# Patient Record
Sex: Male | Born: 1950 | Race: Black or African American | Hispanic: No | Marital: Single
Health system: Southern US, Community
[De-identification: ages and names within clinical notes are randomized; demographics above are authoritative.]

---

## 2009-04-18 ENCOUNTER — Emergency Department: Payer: Self-pay | Admitting: Emergency Medicine

## 2009-05-08 ENCOUNTER — Ambulatory Visit: Payer: Self-pay | Admitting: Oncology

## 2009-05-16 ENCOUNTER — Ambulatory Visit: Payer: Self-pay | Admitting: Family Medicine

## 2009-05-17 ENCOUNTER — Ambulatory Visit: Payer: Self-pay | Admitting: Family Medicine

## 2009-06-04 ENCOUNTER — Ambulatory Visit: Payer: Self-pay | Admitting: Family Medicine

## 2009-06-04 ENCOUNTER — Ambulatory Visit: Payer: Self-pay | Admitting: Otolaryngology

## 2009-06-06 ENCOUNTER — Ambulatory Visit: Payer: Self-pay | Admitting: Oncology

## 2009-06-07 ENCOUNTER — Ambulatory Visit: Payer: Self-pay | Admitting: Oncology

## 2009-06-14 ENCOUNTER — Ambulatory Visit: Payer: Self-pay | Admitting: Vascular Surgery

## 2009-06-15 ENCOUNTER — Ambulatory Visit: Payer: Self-pay | Admitting: Oncology

## 2009-06-22 ENCOUNTER — Inpatient Hospital Stay: Payer: Self-pay | Admitting: Oncology

## 2009-07-02 ENCOUNTER — Ambulatory Visit: Payer: Self-pay

## 2009-07-08 ENCOUNTER — Ambulatory Visit: Payer: Self-pay | Admitting: Oncology

## 2009-08-08 ENCOUNTER — Ambulatory Visit: Payer: Self-pay | Admitting: Oncology

## 2009-09-07 ENCOUNTER — Ambulatory Visit: Payer: Self-pay | Admitting: Oncology

## 2009-10-08 ENCOUNTER — Ambulatory Visit: Payer: Self-pay | Admitting: Oncology

## 2009-11-07 ENCOUNTER — Ambulatory Visit: Payer: Self-pay | Admitting: Oncology

## 2009-12-08 ENCOUNTER — Ambulatory Visit: Payer: Self-pay | Admitting: Oncology

## 2010-01-08 ENCOUNTER — Ambulatory Visit: Payer: Self-pay | Admitting: Oncology

## 2010-02-05 ENCOUNTER — Ambulatory Visit: Payer: Self-pay | Admitting: Oncology

## 2010-04-07 ENCOUNTER — Ambulatory Visit: Payer: Self-pay | Admitting: Oncology

## 2010-04-18 ENCOUNTER — Ambulatory Visit: Payer: Self-pay | Admitting: Oncology

## 2010-05-08 ENCOUNTER — Ambulatory Visit: Payer: Self-pay | Admitting: Oncology

## 2010-05-24 ENCOUNTER — Ambulatory Visit: Payer: Self-pay | Admitting: Oncology

## 2010-06-07 ENCOUNTER — Ambulatory Visit: Payer: Self-pay | Admitting: Oncology

## 2011-03-20 ENCOUNTER — Ambulatory Visit: Payer: Self-pay | Admitting: Oncology

## 2011-04-02 ENCOUNTER — Ambulatory Visit: Payer: Self-pay

## 2011-04-08 ENCOUNTER — Ambulatory Visit: Payer: Self-pay | Admitting: Oncology

## 2011-08-15 ENCOUNTER — Ambulatory Visit: Payer: Self-pay | Admitting: Oncology

## 2011-08-18 ENCOUNTER — Ambulatory Visit: Payer: Self-pay | Admitting: Oncology

## 2011-09-08 ENCOUNTER — Ambulatory Visit: Payer: Self-pay | Admitting: Oncology

## 2012-02-12 ENCOUNTER — Ambulatory Visit: Payer: Self-pay | Admitting: Oncology

## 2012-02-18 ENCOUNTER — Ambulatory Visit: Payer: Self-pay | Admitting: Oncology

## 2012-02-18 LAB — COMPREHENSIVE METABOLIC PANEL
Alkaline Phosphatase: 116 U/L (ref 50–136)
Anion Gap: 6 — ABNORMAL LOW (ref 7–16)
BUN: 13 mg/dL (ref 7–18)
Bilirubin,Total: 0.4 mg/dL (ref 0.2–1.0)
Calcium, Total: 10.3 mg/dL — ABNORMAL HIGH (ref 8.5–10.1)
Chloride: 97 mmol/L — ABNORMAL LOW (ref 98–107)
Co2: 31 mmol/L (ref 21–32)
EGFR (African American): >60
EGFR (Non-African Amer.): >60
SGOT(AST): 28 U/L (ref 15–37)
SGPT (ALT): 53 U/L
Total Protein: 9 g/dL — ABNORMAL HIGH (ref 6.4–8.2)

## 2012-02-18 LAB — CBC CANCER CENTER
Basophil %: 0.3 %
Eosinophil #: 0.1 x10 3/mm (ref 0.0–0.7)
Eosinophil %: 0.8 %
HCT: 39.6 % — ABNORMAL LOW (ref 40.0–52.0)
HGB: 12.4 g/dL — ABNORMAL LOW (ref 13.0–18.0)
Lymphocyte #: 1.5 x10 3/mm (ref 1.0–3.6)
MCH: 26.2 pg (ref 26.0–34.0)
MCHC: 31.3 g/dL — ABNORMAL LOW (ref 32.0–36.0)
MCV: 83.9 fL (ref 80–100)
Monocyte #: 1.2 x10 3/mm — ABNORMAL HIGH (ref 0.0–0.7)
Monocyte %: 11.9 %
Neutrophil #: 7.7 x10 3/mm — ABNORMAL HIGH (ref 1.4–6.5)
Platelet: 379 x10 3/mm (ref 150–440)

## 2012-03-03 ENCOUNTER — Ambulatory Visit: Payer: Self-pay | Admitting: Oncology

## 2012-03-03 ENCOUNTER — Emergency Department: Payer: Self-pay | Admitting: Emergency Medicine

## 2012-03-03 LAB — COMPREHENSIVE METABOLIC PANEL
Anion Gap: 8 (ref 7–16)
Calcium, Total: 9.8 mg/dL (ref 8.5–10.1)
Chloride: 96 mmol/L — ABNORMAL LOW (ref 98–107)
Co2: 29 mmol/L (ref 21–32)
Glucose: 114 mg/dL — ABNORMAL HIGH (ref 65–99)
Osmolality: 266 (ref 275–301)
Potassium: 4.1 mmol/L (ref 3.5–5.1)
SGOT(AST): 35 U/L (ref 15–37)
SGPT (ALT): 50 U/L
Sodium: 133 mmol/L — ABNORMAL LOW (ref 136–145)

## 2012-03-03 LAB — CBC
HCT: 37.3 % — ABNORMAL LOW (ref 40.0–52.0)
HGB: 11.6 g/dL — ABNORMAL LOW (ref 13.0–18.0)
MCHC: 31 g/dL — ABNORMAL LOW (ref 32.0–36.0)
MCV: 83 fL (ref 80–100)
RBC: 4.48 10*6/uL (ref 4.40–5.90)
RDW: 11.3 % — ABNORMAL LOW (ref 11.5–14.5)
WBC: 10.4 10*3/uL (ref 3.8–10.6)

## 2012-03-03 LAB — CK TOTAL AND CKMB (NOT AT ARMC): CK, Total: 167 U/L (ref 35–232)

## 2012-03-08 ENCOUNTER — Ambulatory Visit: Payer: Self-pay | Admitting: Oncology

## 2012-04-08 ENCOUNTER — Ambulatory Visit: Payer: Self-pay | Admitting: Oncology

## 2012-04-20 ENCOUNTER — Ambulatory Visit: Payer: Self-pay | Admitting: Oncology

## 2012-05-08 ENCOUNTER — Ambulatory Visit: Payer: Self-pay | Admitting: Oncology

## 2012-07-05 ENCOUNTER — Ambulatory Visit: Payer: Self-pay | Admitting: Oncology

## 2012-07-08 ENCOUNTER — Ambulatory Visit: Payer: Self-pay | Admitting: Oncology

## 2012-07-12 LAB — CBC CANCER CENTER
Basophil #: 0 x10 3/mm (ref 0.0–0.1)
Eosinophil #: 0.1 x10 3/mm (ref 0.0–0.7)
Lymphocyte %: 8.1 %
MCHC: 32.5 g/dL (ref 32.0–36.0)
Monocyte #: 0.7 x10 3/mm (ref 0.2–1.0)
Neutrophil #: 8.2 x10 3/mm — ABNORMAL HIGH (ref 1.4–6.5)
Neutrophil %: 83.8 %
Platelet: 428 x10 3/mm (ref 150–440)
RBC: 4.26 10*6/uL — ABNORMAL LOW (ref 4.40–5.90)
RDW: 14.4 % (ref 11.5–14.5)

## 2012-07-12 LAB — COMPREHENSIVE METABOLIC PANEL
Albumin: 3.2 g/dL — ABNORMAL LOW (ref 3.4–5.0)
BUN: 11 mg/dL (ref 7–18)
Calcium, Total: 12.2 mg/dL — ABNORMAL HIGH (ref 8.5–10.1)
Chloride: 98 mmol/L (ref 98–107)
EGFR (Non-African Amer.): >60
Glucose: 141 mg/dL — ABNORMAL HIGH (ref 65–99)
Osmolality: 276 (ref 275–301)
Potassium: 3.8 mmol/L (ref 3.5–5.1)
SGOT(AST): 23 U/L (ref 15–37)
Sodium: 137 mmol/L (ref 136–145)

## 2012-07-19 LAB — COMPREHENSIVE METABOLIC PANEL
Albumin: 3.3 g/dL — ABNORMAL LOW (ref 3.4–5.0)
Alkaline Phosphatase: 101 U/L (ref 50–136)
Calcium, Total: 11.3 mg/dL — ABNORMAL HIGH (ref 8.5–10.1)
Co2: 30 mmol/L (ref 21–32)
Creatinine: 0.65 mg/dL (ref 0.60–1.30)
EGFR (Non-African Amer.): >60
Glucose: 104 mg/dL — ABNORMAL HIGH (ref 65–99)
Osmolality: 268 (ref 275–301)
SGPT (ALT): 31 U/L (ref 12–78)
Sodium: 134 mmol/L — ABNORMAL LOW (ref 136–145)

## 2012-07-19 LAB — CBC CANCER CENTER
Basophil #: 0 x10 3/mm (ref 0.0–0.1)
Basophil %: 0.4 %
Eosinophil #: 0.1 x10 3/mm (ref 0.0–0.7)
HCT: 32.9 % — ABNORMAL LOW (ref 40.0–52.0)
HGB: 10.5 g/dL — ABNORMAL LOW (ref 13.0–18.0)
MCH: 24.8 pg — ABNORMAL LOW (ref 26.0–34.0)
MCV: 78 fL — ABNORMAL LOW (ref 80–100)
Monocyte #: 1.1 x10 3/mm — ABNORMAL HIGH (ref 0.2–1.0)
Monocyte %: 11 %
Neutrophil #: 7.6 x10 3/mm — ABNORMAL HIGH (ref 1.4–6.5)
Neutrophil %: 76.9 %
Platelet: 443 x10 3/mm — ABNORMAL HIGH (ref 150–440)
RBC: 4.23 10*6/uL — ABNORMAL LOW (ref 4.40–5.90)
WBC: 9.9 x10 3/mm (ref 3.8–10.6)

## 2012-07-24 ENCOUNTER — Inpatient Hospital Stay: Payer: Self-pay | Admitting: Internal Medicine

## 2012-07-24 LAB — COMPREHENSIVE METABOLIC PANEL
Alkaline Phosphatase: 81 U/L (ref 50–136)
Bilirubin,Total: 0.5 mg/dL (ref 0.2–1.0)
Calcium, Total: 8.8 mg/dL (ref 8.5–10.1)
Chloride: 96 mmol/L — ABNORMAL LOW (ref 98–107)
Co2: 28 mmol/L (ref 21–32)
Creatinine: 0.63 mg/dL (ref 0.60–1.30)
EGFR (African American): >60
EGFR (Non-African Amer.): >60
Osmolality: 268 (ref 275–301)
SGOT(AST): 22 U/L (ref 15–37)
Sodium: 133 mmol/L — ABNORMAL LOW (ref 136–145)

## 2012-07-24 LAB — URINALYSIS, COMPLETE
Ketone: NEGATIVE
Nitrite: NEGATIVE
Protein: NEGATIVE
RBC,UR: NONE SEEN /HPF (ref 0–5)
Renal Epithelial: 3
WBC UR: 2 /HPF (ref 0–5)

## 2012-07-24 LAB — CBC
HCT: 30.3 % — ABNORMAL LOW (ref 40.0–52.0)
MCV: 77 fL — ABNORMAL LOW (ref 80–100)
RBC: 3.91 10*6/uL — ABNORMAL LOW (ref 4.40–5.90)
RDW: 14.4 % (ref 11.5–14.5)
WBC: 14.6 10*3/uL — ABNORMAL HIGH (ref 3.8–10.6)

## 2012-07-25 LAB — CBC WITH DIFFERENTIAL/PLATELET
Basophil #: 0.1 10*3/uL (ref 0.0–0.1)
Basophil %: 0.4 %
Eosinophil #: 0.1 10*3/uL (ref 0.0–0.7)
Eosinophil %: 1 %
HCT: 28.7 % — ABNORMAL LOW (ref 40.0–52.0)
HGB: 8.8 g/dL — ABNORMAL LOW (ref 13.0–18.0)
Lymphocyte %: 6.1 %
MCHC: 30.6 g/dL — ABNORMAL LOW (ref 32.0–36.0)
MCV: 78 fL — ABNORMAL LOW (ref 80–100)
Monocyte #: 1.2 x10 3/mm — ABNORMAL HIGH (ref 0.2–1.0)
Neutrophil #: 11.4 10*3/uL — ABNORMAL HIGH (ref 1.4–6.5)
RBC: 3.7 10*6/uL — ABNORMAL LOW (ref 4.40–5.90)
RDW: 14.3 % (ref 11.5–14.5)

## 2012-07-26 LAB — BASIC METABOLIC PANEL
Anion Gap: 7 (ref 7–16)
Calcium, Total: 8.3 mg/dL — ABNORMAL LOW (ref 8.5–10.1)
Chloride: 102 mmol/L (ref 98–107)
Co2: 27 mmol/L (ref 21–32)
Creatinine: 0.56 mg/dL — ABNORMAL LOW (ref 0.60–1.30)
EGFR (African American): >60
Potassium: 4 mmol/L (ref 3.5–5.1)
Sodium: 136 mmol/L (ref 136–145)

## 2012-07-26 LAB — CBC WITH DIFFERENTIAL/PLATELET
Basophil #: 0.1 10*3/uL (ref 0.0–0.1)
Basophil %: 0.4 %
Eosinophil #: 0.1 10*3/uL (ref 0.0–0.7)
Eosinophil %: 0.6 %
HCT: 27.4 % — ABNORMAL LOW (ref 40.0–52.0)
HGB: 8.5 g/dL — ABNORMAL LOW (ref 13.0–18.0)
Lymphocyte #: 0.7 10*3/uL — ABNORMAL LOW (ref 1.0–3.6)
Lymphocyte %: 4.8 %
MCHC: 31 g/dL — ABNORMAL LOW (ref 32.0–36.0)
MCV: 77 fL — ABNORMAL LOW (ref 80–100)
Monocyte %: 8 %
Neutrophil #: 11.9 10*3/uL — ABNORMAL HIGH (ref 1.4–6.5)
Neutrophil %: 86.2 %
Platelet: 450 10*3/uL — ABNORMAL HIGH (ref 150–440)
RBC: 3.55 10*6/uL — ABNORMAL LOW (ref 4.40–5.90)
RDW: 14.4 % (ref 11.5–14.5)
WBC: 13.8 10*3/uL — ABNORMAL HIGH (ref 3.8–10.6)

## 2012-07-26 LAB — IRON AND TIBC: Iron Bind.Cap.(Total): 198 ug/dL — ABNORMAL LOW (ref 250–450)

## 2012-07-26 LAB — FERRITIN: Ferritin (ARMC): 714 ng/mL — ABNORMAL HIGH (ref 8–388)

## 2012-07-27 LAB — CBC WITH DIFFERENTIAL/PLATELET
Basophil %: 0.2 %
Eosinophil %: 0.7 %
HCT: 25.9 % — ABNORMAL LOW (ref 40.0–52.0)
HGB: 8 g/dL — ABNORMAL LOW (ref 13.0–18.0)
Lymphocyte #: 0.6 10*3/uL — ABNORMAL LOW (ref 1.0–3.6)
Lymphocyte %: 5 %
MCHC: 30.7 g/dL — ABNORMAL LOW (ref 32.0–36.0)
MCV: 77 fL — ABNORMAL LOW (ref 80–100)
Monocyte %: 9.2 %
Neutrophil #: 10.7 10*3/uL — ABNORMAL HIGH (ref 1.4–6.5)
Neutrophil %: 84.9 %
RBC: 3.36 10*6/uL — ABNORMAL LOW (ref 4.40–5.90)
WBC: 12.6 10*3/uL — ABNORMAL HIGH (ref 3.8–10.6)

## 2012-07-28 LAB — CBC WITH DIFFERENTIAL/PLATELET
Basophil #: 0.1 10*3/uL (ref 0.0–0.1)
Eosinophil #: 0.2 10*3/uL (ref 0.0–0.7)
HCT: 27.2 % — ABNORMAL LOW (ref 40.0–52.0)
Lymphocyte %: 6.2 %
MCHC: 32.7 g/dL (ref 32.0–36.0)
MCV: 77 fL — ABNORMAL LOW (ref 80–100)
Monocyte #: 1.2 x10 3/mm — ABNORMAL HIGH (ref 0.2–1.0)
Monocyte %: 11.5 %
Neutrophil #: 8.4 10*3/uL — ABNORMAL HIGH (ref 1.4–6.5)
Neutrophil %: 79.3 %
RBC: 3.54 10*6/uL — ABNORMAL LOW (ref 4.40–5.90)
RDW: 14.4 % (ref 11.5–14.5)
WBC: 10.7 10*3/uL — ABNORMAL HIGH (ref 3.8–10.6)

## 2012-07-29 LAB — CBC WITH DIFFERENTIAL/PLATELET
Basophil %: 0.8 %
Eosinophil #: 0.2 10*3/uL (ref 0.0–0.7)
Eosinophil %: 2 %
HGB: 8.9 g/dL — ABNORMAL LOW (ref 13.0–18.0)
Lymphocyte %: 7.6 %
MCH: 23.8 pg — ABNORMAL LOW (ref 26.0–34.0)
MCV: 77 fL — ABNORMAL LOW (ref 80–100)
Monocyte %: 10 %
Neutrophil #: 8.4 10*3/uL — ABNORMAL HIGH (ref 1.4–6.5)
Platelet: 611 10*3/uL — ABNORMAL HIGH (ref 150–440)
RDW: 14.6 % — ABNORMAL HIGH (ref 11.5–14.5)

## 2012-08-03 LAB — COMPREHENSIVE METABOLIC PANEL
Alkaline Phosphatase: 95 U/L (ref 50–136)
Bilirubin,Total: 0.5 mg/dL (ref 0.2–1.0)
Calcium, Total: 9.3 mg/dL (ref 8.5–10.1)
Chloride: 94 mmol/L — ABNORMAL LOW (ref 98–107)
Co2: 32 mmol/L (ref 21–32)
Creatinine: 0.71 mg/dL (ref 0.60–1.30)
EGFR (African American): >60
EGFR (Non-African Amer.): >60
Osmolality: 265 (ref 275–301)
SGOT(AST): 31 U/L (ref 15–37)
SGPT (ALT): 41 U/L (ref 12–78)

## 2012-08-03 LAB — CBC CANCER CENTER
Basophil #: 0 x10 3/mm (ref 0.0–0.1)
Eosinophil %: 1.2 %
HCT: 29.1 % — ABNORMAL LOW (ref 40.0–52.0)
Lymphocyte %: 7.8 %
Monocyte %: 13 %
Platelet: 576 x10 3/mm — ABNORMAL HIGH (ref 150–440)
RBC: 3.74 10*6/uL — ABNORMAL LOW (ref 4.40–5.90)
RDW: 14.1 % (ref 11.5–14.5)
WBC: 9.9 x10 3/mm (ref 3.8–10.6)

## 2012-08-08 ENCOUNTER — Ambulatory Visit: Payer: Self-pay | Admitting: Oncology

## 2012-08-10 LAB — CBC CANCER CENTER
Basophil %: 0.4 %
Eosinophil #: 0.1 x10 3/mm (ref 0.0–0.7)
Eosinophil %: 0.9 %
HGB: 9.6 g/dL — ABNORMAL LOW (ref 13.0–18.0)
Lymphocyte #: 0.7 x10 3/mm — ABNORMAL LOW (ref 1.0–3.6)
Lymphocyte %: 7.2 %
MCH: 23.8 pg — ABNORMAL LOW (ref 26.0–34.0)
MCHC: 30.6 g/dL — ABNORMAL LOW (ref 32.0–36.0)
MCV: 78 fL — ABNORMAL LOW (ref 80–100)
Monocyte #: 1.1 x10 3/mm — ABNORMAL HIGH (ref 0.2–1.0)
Neutrophil %: 80.7 %
Platelet: 531 x10 3/mm — ABNORMAL HIGH (ref 150–440)
RBC: 4.04 10*6/uL — ABNORMAL LOW (ref 4.40–5.90)

## 2012-08-12 ENCOUNTER — Emergency Department: Payer: Self-pay | Admitting: Emergency Medicine

## 2012-08-12 LAB — COMPREHENSIVE METABOLIC PANEL
Bilirubin,Total: 0.7 mg/dL (ref 0.2–1.0)
Chloride: 99 mmol/L (ref 98–107)
Co2: 30 mmol/L (ref 21–32)
Creatinine: 0.53 mg/dL — ABNORMAL LOW (ref 0.60–1.30)
EGFR (African American): >60
EGFR (Non-African Amer.): >60
Glucose: 111 mg/dL — ABNORMAL HIGH (ref 65–99)
Osmolality: 267 (ref 275–301)
Potassium: 3.8 mmol/L (ref 3.5–5.1)
SGPT (ALT): 35 U/L (ref 12–78)
Sodium: 133 mmol/L — ABNORMAL LOW (ref 136–145)
Total Protein: 8.8 g/dL — ABNORMAL HIGH (ref 6.4–8.2)

## 2012-08-12 LAB — CBC
Platelet: 518 10*3/uL — ABNORMAL HIGH (ref 150–440)
RBC: 4.15 10*6/uL — ABNORMAL LOW (ref 4.40–5.90)
RDW: 14.6 % — ABNORMAL HIGH (ref 11.5–14.5)
WBC: 11.6 10*3/uL — ABNORMAL HIGH (ref 3.8–10.6)

## 2012-08-12 LAB — LIPASE, BLOOD: Lipase: 247 U/L (ref 73–393)

## 2012-08-19 LAB — COMPREHENSIVE METABOLIC PANEL
Alkaline Phosphatase: 116 U/L (ref 50–136)
Anion Gap: 8 (ref 7–16)
Bilirubin,Total: 0.9 mg/dL (ref 0.2–1.0)
Calcium, Total: 10 mg/dL (ref 8.5–10.1)
Chloride: 100 mmol/L (ref 98–107)
Co2: 32 mmol/L (ref 21–32)
Creatinine: 0.89 mg/dL (ref 0.60–1.30)
EGFR (African American): >60
Potassium: 3.7 mmol/L (ref 3.5–5.1)
SGPT (ALT): 47 U/L (ref 12–78)
Sodium: 140 mmol/L (ref 136–145)
Total Protein: 9.2 g/dL — ABNORMAL HIGH (ref 6.4–8.2)

## 2012-08-19 LAB — CBC CANCER CENTER
Basophil #: 0 x10 3/mm (ref 0.0–0.1)
Basophil %: 0.2 %
Eosinophil #: 0.1 x10 3/mm (ref 0.0–0.7)
HCT: 34.9 % — ABNORMAL LOW (ref 40.0–52.0)
Lymphocyte %: 6.8 %
MCH: 23.8 pg — ABNORMAL LOW (ref 26.0–34.0)
MCHC: 30.3 g/dL — ABNORMAL LOW (ref 32.0–36.0)
Monocyte #: 0.7 x10 3/mm (ref 0.2–1.0)
Monocyte %: 6.6 %
Neutrophil #: 9.7 x10 3/mm — ABNORMAL HIGH (ref 1.4–6.5)
Neutrophil %: 85.5 %
Platelet: 548 x10 3/mm — ABNORMAL HIGH (ref 150–440)
RDW: 14.7 % — ABNORMAL HIGH (ref 11.5–14.5)
WBC: 11.4 x10 3/mm — ABNORMAL HIGH (ref 3.8–10.6)

## 2012-09-07 ENCOUNTER — Ambulatory Visit: Payer: Self-pay | Admitting: Oncology

## 2012-09-07 LAB — CBC CANCER CENTER
Basophil #: 0 x10 3/mm (ref 0.0–0.1)
Eosinophil #: 0.1 x10 3/mm (ref 0.0–0.7)
Lymphocyte #: 0.6 x10 3/mm — ABNORMAL LOW (ref 1.0–3.6)
MCH: 23.3 pg — ABNORMAL LOW (ref 26.0–34.0)
MCHC: 30.2 g/dL — ABNORMAL LOW (ref 32.0–36.0)
MCV: 77 fL — ABNORMAL LOW (ref 80–100)
Monocyte #: 0.8 x10 3/mm (ref 0.2–1.0)
Neutrophil %: 76.2 %
Platelet: 536 x10 3/mm — ABNORMAL HIGH (ref 150–440)
RBC: 3.91 10*6/uL — ABNORMAL LOW (ref 4.40–5.90)
RDW: 15.5 % — ABNORMAL HIGH (ref 11.5–14.5)
WBC: 6.1 x10 3/mm (ref 3.8–10.6)

## 2012-09-07 LAB — COMPREHENSIVE METABOLIC PANEL
Albumin: 3 g/dL — ABNORMAL LOW (ref 3.4–5.0)
Alkaline Phosphatase: 93 U/L (ref 50–136)
Calcium, Total: 9.5 mg/dL (ref 8.5–10.1)
Co2: 30 mmol/L (ref 21–32)
EGFR (African American): >60
EGFR (Non-African Amer.): >60
Glucose: 125 mg/dL — ABNORMAL HIGH (ref 65–99)
Osmolality: 277 (ref 275–301)
Potassium: 4 mmol/L (ref 3.5–5.1)
SGOT(AST): 32 U/L (ref 15–37)
SGPT (ALT): 28 U/L (ref 12–78)
Sodium: 138 mmol/L (ref 136–145)

## 2012-09-08 LAB — CBC CANCER CENTER
Basophil #: 0 x10 3/mm (ref 0.0–0.1)
Eosinophil #: 0.1 x10 3/mm (ref 0.0–0.7)
Eosinophil %: 0.9 %
HCT: 27.6 % — ABNORMAL LOW (ref 40.0–52.0)
Lymphocyte %: 8 %
MCHC: 30.6 g/dL — ABNORMAL LOW (ref 32.0–36.0)
MCV: 77 fL — ABNORMAL LOW (ref 80–100)
Monocyte %: 13.4 %
Neutrophil #: 4.9 x10 3/mm (ref 1.4–6.5)
Neutrophil %: 77.3 %
Platelet: 517 x10 3/mm — ABNORMAL HIGH (ref 150–440)
RDW: 15.4 % — ABNORMAL HIGH (ref 11.5–14.5)
WBC: 6.4 x10 3/mm (ref 3.8–10.6)

## 2012-09-17 LAB — CBC CANCER CENTER
Basophil %: 0.6 %
Eosinophil %: 1.4 %
HCT: 28.1 % — ABNORMAL LOW (ref 40.0–52.0)
HGB: 8.6 g/dL — ABNORMAL LOW (ref 13.0–18.0)
Lymphocyte #: 0.5 x10 3/mm — ABNORMAL LOW (ref 1.0–3.6)
Lymphocyte %: 7.1 %
MCH: 23.5 pg — ABNORMAL LOW (ref 26.0–34.0)
MCHC: 30.6 g/dL — ABNORMAL LOW (ref 32.0–36.0)
Monocyte #: 1.1 x10 3/mm — ABNORMAL HIGH (ref 0.2–1.0)
Monocyte %: 17.1 %
Neutrophil %: 73.8 %
RBC: 3.66 10*6/uL — ABNORMAL LOW (ref 4.40–5.90)
WBC: 6.4 x10 3/mm (ref 3.8–10.6)

## 2012-09-17 LAB — COMPREHENSIVE METABOLIC PANEL
Alkaline Phosphatase: 84 U/L (ref 50–136)
Anion Gap: 5 — ABNORMAL LOW (ref 7–16)
BUN: 11 mg/dL (ref 7–18)
Bilirubin,Total: 0.4 mg/dL (ref 0.2–1.0)
Chloride: 98 mmol/L (ref 98–107)
Creatinine: 0.56 mg/dL — ABNORMAL LOW (ref 0.60–1.30)
EGFR (African American): >60
Potassium: 4.1 mmol/L (ref 3.5–5.1)
SGPT (ALT): 30 U/L (ref 12–78)
Total Protein: 7.9 g/dL (ref 6.4–8.2)

## 2012-09-20 LAB — CBC CANCER CENTER
Basophil #: 0 x10 3/mm (ref 0.0–0.1)
Basophil %: 0.3 %
Eosinophil #: 0 x10 3/mm (ref 0.0–0.7)
HGB: 8.6 g/dL — ABNORMAL LOW (ref 13.0–18.0)
MCH: 23.3 pg — ABNORMAL LOW (ref 26.0–34.0)
MCV: 78 fL — ABNORMAL LOW (ref 80–100)
Monocyte %: 15.2 %
Neutrophil #: 5.5 x10 3/mm (ref 1.4–6.5)
Neutrophil %: 77.4 %
Platelet: 453 x10 3/mm — ABNORMAL HIGH (ref 150–440)
RBC: 3.7 10*6/uL — ABNORMAL LOW (ref 4.40–5.90)
WBC: 7.1 x10 3/mm (ref 3.8–10.6)

## 2012-10-08 ENCOUNTER — Ambulatory Visit: Payer: Self-pay | Admitting: Oncology

## 2012-10-22 LAB — COMPREHENSIVE METABOLIC PANEL
Albumin: 2.7 g/dL — ABNORMAL LOW (ref 3.4–5.0)
Anion Gap: 6 — ABNORMAL LOW (ref 7–16)
BUN: 12 mg/dL (ref 7–18)
Bilirubin,Total: 0.6 mg/dL (ref 0.2–1.0)
Calcium, Total: 9.2 mg/dL (ref 8.5–10.1)
Co2: 32 mmol/L (ref 21–32)
Creatinine: 0.64 mg/dL (ref 0.60–1.30)
EGFR (African American): >60
EGFR (Non-African Amer.): >60
Glucose: 120 mg/dL — ABNORMAL HIGH (ref 65–99)
Osmolality: 267 (ref 275–301)
SGOT(AST): 44 U/L — ABNORMAL HIGH (ref 15–37)
Sodium: 133 mmol/L — ABNORMAL LOW (ref 136–145)

## 2012-10-22 LAB — CBC CANCER CENTER
Basophil %: 0.1 %
Eosinophil #: 0.1 x10 3/mm (ref 0.0–0.7)
Eosinophil %: 0.8 %
Lymphocyte #: 0.5 x10 3/mm — ABNORMAL LOW (ref 1.0–3.6)
Lymphocyte %: 6.3 %
MCH: 24.7 pg — ABNORMAL LOW (ref 26.0–34.0)
MCHC: 31 g/dL — ABNORMAL LOW (ref 32.0–36.0)
MCV: 80 fL (ref 80–100)
Monocyte #: 0.9 x10 3/mm (ref 0.2–1.0)
Platelet: 374 x10 3/mm (ref 150–440)
RBC: 3.93 10*6/uL — ABNORMAL LOW (ref 4.40–5.90)
RDW: 15.5 % — ABNORMAL HIGH (ref 11.5–14.5)
WBC: 7.4 x10 3/mm (ref 3.8–10.6)

## 2012-10-29 LAB — CBC CANCER CENTER
Basophil #: 0 x10 3/mm (ref 0.0–0.1)
Eosinophil #: 0.1 x10 3/mm (ref 0.0–0.7)
HCT: 32 % — ABNORMAL LOW (ref 40.0–52.0)
Lymphocyte #: 0.7 x10 3/mm — ABNORMAL LOW (ref 1.0–3.6)
Lymphocyte %: 5.5 %
MCHC: 30.6 g/dL — ABNORMAL LOW (ref 32.0–36.0)
MCV: 80 fL (ref 80–100)
Monocyte %: 6.6 %
Neutrophil #: 10.6 x10 3/mm — ABNORMAL HIGH (ref 1.4–6.5)
Neutrophil %: 87.1 %
Platelet: 492 x10 3/mm — ABNORMAL HIGH (ref 150–440)
RDW: 15 % — ABNORMAL HIGH (ref 11.5–14.5)

## 2012-10-29 LAB — COMPREHENSIVE METABOLIC PANEL
Albumin: 2.8 g/dL — ABNORMAL LOW (ref 3.4–5.0)
Anion Gap: 7 (ref 7–16)
BUN: 15 mg/dL (ref 7–18)
Chloride: 96 mmol/L — ABNORMAL LOW (ref 98–107)
Osmolality: 274 (ref 275–301)
Potassium: 3.8 mmol/L (ref 3.5–5.1)
Sodium: 136 mmol/L (ref 136–145)
Total Protein: 8.1 g/dL (ref 6.4–8.2)

## 2012-11-07 ENCOUNTER — Ambulatory Visit: Payer: Self-pay | Admitting: Oncology

## 2012-11-11 ENCOUNTER — Ambulatory Visit: Payer: Self-pay | Admitting: Oncology

## 2012-11-12 LAB — CBC CANCER CENTER
Basophil #: 0.1 x10 3/mm (ref 0.0–0.1)
Basophil %: 0.3 %
Eosinophil #: 0 x10 3/mm (ref 0.0–0.7)
HCT: 31.8 % — ABNORMAL LOW (ref 40.0–52.0)
HGB: 10.3 g/dL — ABNORMAL LOW (ref 13.0–18.0)
Lymphocyte #: 0.4 x10 3/mm — ABNORMAL LOW (ref 1.0–3.6)
Lymphocyte %: 2.4 %
MCH: 25.1 pg — ABNORMAL LOW (ref 26.0–34.0)
MCHC: 32.4 g/dL (ref 32.0–36.0)
MCV: 78 fL — ABNORMAL LOW (ref 80–100)
Monocyte #: 1.3 x10 3/mm — ABNORMAL HIGH (ref 0.2–1.0)
Monocyte %: 7 %
Neutrophil #: 16.7 x10 3/mm — ABNORMAL HIGH (ref 1.4–6.5)
Platelet: 426 x10 3/mm (ref 150–440)

## 2012-11-12 LAB — COMPREHENSIVE METABOLIC PANEL
Anion Gap: 9 (ref 7–16)
BUN: 11 mg/dL (ref 7–18)
Bilirubin,Total: 0.6 mg/dL (ref 0.2–1.0)
Calcium, Total: 9.4 mg/dL (ref 8.5–10.1)
Chloride: 97 mmol/L — ABNORMAL LOW (ref 98–107)
Co2: 32 mmol/L (ref 21–32)
Creatinine: 0.63 mg/dL (ref 0.60–1.30)
EGFR (African American): >60
Osmolality: 277 (ref 275–301)
Sodium: 138 mmol/L (ref 136–145)
Total Protein: 8.5 g/dL — ABNORMAL HIGH (ref 6.4–8.2)

## 2012-11-19 ENCOUNTER — Inpatient Hospital Stay: Payer: Self-pay | Admitting: Oncology

## 2012-11-19 LAB — COMPREHENSIVE METABOLIC PANEL
Albumin: 2.8 g/dL — ABNORMAL LOW (ref 3.4–5.0)
Alkaline Phosphatase: 102 U/L (ref 50–136)
BUN: 12 mg/dL (ref 7–18)
Bilirubin,Total: 0.3 mg/dL (ref 0.2–1.0)
Calcium, Total: 9.3 mg/dL (ref 8.5–10.1)
Chloride: 98 mmol/L (ref 98–107)
Co2: 32 mmol/L (ref 21–32)
Creatinine: 0.64 mg/dL (ref 0.60–1.30)
EGFR (African American): >60
EGFR (Non-African Amer.): >60
Glucose: 124 mg/dL — ABNORMAL HIGH (ref 65–99)
Potassium: 3.8 mmol/L (ref 3.5–5.1)
SGPT (ALT): 50 U/L (ref 12–78)
Sodium: 135 mmol/L — ABNORMAL LOW (ref 136–145)

## 2012-11-19 LAB — CBC CANCER CENTER
Basophil #: 0.1 x10 3/mm (ref 0.0–0.1)
HCT: 33.5 % — ABNORMAL LOW (ref 40.0–52.0)
HGB: 10.9 g/dL — ABNORMAL LOW (ref 13.0–18.0)
Lymphocyte #: 0.9 x10 3/mm — ABNORMAL LOW (ref 1.0–3.6)
Lymphocyte %: 9.7 %
MCV: 79 fL — ABNORMAL LOW (ref 80–100)
Monocyte #: 1.1 x10 3/mm — ABNORMAL HIGH (ref 0.2–1.0)
Monocyte %: 12.6 %
Neutrophil %: 75.6 %
Platelet: 442 x10 3/mm — ABNORMAL HIGH (ref 150–440)
RDW: 14.3 % (ref 11.5–14.5)
WBC: 8.9 x10 3/mm (ref 3.8–10.6)

## 2012-11-20 LAB — COMPREHENSIVE METABOLIC PANEL
Albumin: 2.6 g/dL — ABNORMAL LOW (ref 3.4–5.0)
Anion Gap: 5 — ABNORMAL LOW (ref 7–16)
Bilirubin,Total: 0.4 mg/dL (ref 0.2–1.0)
Chloride: 104 mmol/L (ref 98–107)
Creatinine: 0.47 mg/dL — ABNORMAL LOW (ref 0.60–1.30)
EGFR (African American): >60
Glucose: 109 mg/dL — ABNORMAL HIGH (ref 65–99)
Osmolality: 277 (ref 275–301)
Potassium: 4.4 mmol/L (ref 3.5–5.1)
Sodium: 138 mmol/L (ref 136–145)
Total Protein: 7.2 g/dL (ref 6.4–8.2)

## 2012-11-20 LAB — CBC WITH DIFFERENTIAL/PLATELET
Basophil #: 0 10*3/uL (ref 0.0–0.1)
Eosinophil %: 0 %
HCT: 29.5 % — ABNORMAL LOW (ref 40.0–52.0)
HGB: 10.2 g/dL — ABNORMAL LOW (ref 13.0–18.0)
Lymphocyte #: 0.4 10*3/uL — ABNORMAL LOW (ref 1.0–3.6)
Lymphocyte %: 3 %
Monocyte #: 0.6 x10 3/mm (ref 0.2–1.0)
Monocyte %: 5.4 %
Neutrophil #: 11 10*3/uL — ABNORMAL HIGH (ref 1.4–6.5)
Platelet: 394 10*3/uL (ref 150–440)
RDW: 15 % — ABNORMAL HIGH (ref 11.5–14.5)
WBC: 12.1 10*3/uL — ABNORMAL HIGH (ref 3.8–10.6)

## 2012-11-26 LAB — COMPREHENSIVE METABOLIC PANEL
Albumin: 3.1 g/dL — ABNORMAL LOW (ref 3.4–5.0)
Alkaline Phosphatase: 125 U/L (ref 50–136)
Anion Gap: 8 (ref 7–16)
Bilirubin,Total: 0.4 mg/dL (ref 0.2–1.0)
Chloride: 96 mmol/L — ABNORMAL LOW (ref 98–107)
Creatinine: 0.55 mg/dL — ABNORMAL LOW (ref 0.60–1.30)
Glucose: 103 mg/dL — ABNORMAL HIGH (ref 65–99)
Osmolality: 270 (ref 275–301)
Potassium: 3.8 mmol/L (ref 3.5–5.1)
Sodium: 135 mmol/L — ABNORMAL LOW (ref 136–145)

## 2012-11-26 LAB — CBC CANCER CENTER
Eosinophil %: 1.1 %
HCT: 34.5 % — ABNORMAL LOW (ref 40.0–52.0)
HGB: 11.2 g/dL — ABNORMAL LOW (ref 13.0–18.0)
Lymphocyte #: 0.9 x10 3/mm — ABNORMAL LOW (ref 1.0–3.6)
Lymphocyte %: 9.3 %
MCH: 25.7 pg — ABNORMAL LOW (ref 26.0–34.0)
MCV: 79 fL — ABNORMAL LOW (ref 80–100)
Monocyte #: 0.8 x10 3/mm (ref 0.2–1.0)
Monocyte %: 8.1 %
Neutrophil %: 80.9 %
Platelet: 405 x10 3/mm (ref 150–440)

## 2012-12-08 ENCOUNTER — Ambulatory Visit: Payer: Self-pay | Admitting: Oncology

## 2012-12-10 LAB — CBC CANCER CENTER
Basophil %: 0.5 %
Eosinophil #: 0.1 x10 3/mm (ref 0.0–0.7)
HCT: 35 % — ABNORMAL LOW (ref 40.0–52.0)
HGB: 11.3 g/dL — ABNORMAL LOW (ref 13.0–18.0)
Lymphocyte %: 14.6 %
MCHC: 32.4 g/dL (ref 32.0–36.0)
Monocyte %: 16.2 %
Neutrophil #: 3.6 x10 3/mm (ref 1.4–6.5)
RBC: 4.37 10*6/uL — ABNORMAL LOW (ref 4.40–5.90)
RDW: 14.8 % — ABNORMAL HIGH (ref 11.5–14.5)

## 2012-12-10 LAB — COMPREHENSIVE METABOLIC PANEL
Albumin: 3 g/dL — ABNORMAL LOW (ref 3.4–5.0)
BUN: 12 mg/dL (ref 7–18)
Bilirubin,Total: 0.4 mg/dL (ref 0.2–1.0)
Calcium, Total: 9.5 mg/dL (ref 8.5–10.1)
Chloride: 98 mmol/L (ref 98–107)
Creatinine: 0.54 mg/dL — ABNORMAL LOW (ref 0.60–1.30)
EGFR (African American): >60
EGFR (Non-African Amer.): >60
Glucose: 95 mg/dL (ref 65–99)
Osmolality: 272 (ref 275–301)
Potassium: 3.9 mmol/L (ref 3.5–5.1)
SGOT(AST): 38 U/L — ABNORMAL HIGH (ref 15–37)
Sodium: 136 mmol/L (ref 136–145)
Total Protein: 8.2 g/dL (ref 6.4–8.2)

## 2012-12-24 LAB — CBC CANCER CENTER
Basophil #: 0 x10 3/mm (ref 0.0–0.1)
Basophil %: 0.3 %
Eosinophil #: 0 x10 3/mm (ref 0.0–0.7)
HGB: 10.2 g/dL — ABNORMAL LOW (ref 13.0–18.0)
Lymphocyte #: 0.5 x10 3/mm — ABNORMAL LOW (ref 1.0–3.6)
MCH: 25.6 pg — ABNORMAL LOW (ref 26.0–34.0)
MCHC: 31.8 g/dL — ABNORMAL LOW (ref 32.0–36.0)
Monocyte #: 1 x10 3/mm (ref 0.2–1.0)
Monocyte %: 8.2 %
Neutrophil %: 87.3 %
Platelet: 276 x10 3/mm (ref 150–440)
RBC: 4 10*6/uL — ABNORMAL LOW (ref 4.40–5.90)
RDW: 13.8 % (ref 11.5–14.5)

## 2012-12-24 LAB — COMPREHENSIVE METABOLIC PANEL
Albumin: 3 g/dL — ABNORMAL LOW (ref 3.4–5.0)
Alkaline Phosphatase: 98 U/L (ref 50–136)
BUN: 15 mg/dL (ref 7–18)
Bilirubin,Total: 0.6 mg/dL (ref 0.2–1.0)
Chloride: 99 mmol/L (ref 98–107)
Co2: 33 mmol/L — ABNORMAL HIGH (ref 21–32)
Creatinine: 0.57 mg/dL — ABNORMAL LOW (ref 0.60–1.30)
EGFR (African American): >60
Glucose: 126 mg/dL — ABNORMAL HIGH (ref 65–99)
Osmolality: 274 (ref 275–301)
Potassium: 4.2 mmol/L (ref 3.5–5.1)
SGPT (ALT): 34 U/L (ref 12–78)
Sodium: 136 mmol/L (ref 136–145)

## 2012-12-31 LAB — CBC CANCER CENTER
Basophil #: 0 x10 3/mm (ref 0.0–0.1)
Basophil %: 0.4 %
Eosinophil %: 1 %
HCT: 33 % — ABNORMAL LOW (ref 40.0–52.0)
HGB: 10.5 g/dL — ABNORMAL LOW (ref 13.0–18.0)
Lymphocyte #: 0.5 x10 3/mm — ABNORMAL LOW (ref 1.0–3.6)
MCV: 80 fL (ref 80–100)
Monocyte #: 0.7 x10 3/mm (ref 0.2–1.0)
Monocyte %: 14.7 %
Neutrophil %: 72.7 %
RBC: 4.13 10*6/uL — ABNORMAL LOW (ref 4.40–5.90)
WBC: 4.5 x10 3/mm (ref 3.8–10.6)

## 2012-12-31 LAB — COMPREHENSIVE METABOLIC PANEL
Anion Gap: 1 — ABNORMAL LOW (ref 7–16)
Bilirubin,Total: 0.6 mg/dL (ref 0.2–1.0)
Chloride: 97 mmol/L — ABNORMAL LOW (ref 98–107)
Co2: 36 mmol/L — ABNORMAL HIGH (ref 21–32)
Creatinine: 0.55 mg/dL — ABNORMAL LOW (ref 0.60–1.30)
EGFR (African American): >60
Glucose: 129 mg/dL — ABNORMAL HIGH (ref 65–99)
Osmolality: 269 (ref 275–301)
Potassium: 3.8 mmol/L (ref 3.5–5.1)
SGOT(AST): 34 U/L (ref 15–37)
Sodium: 134 mmol/L — ABNORMAL LOW (ref 136–145)
Total Protein: 8 g/dL (ref 6.4–8.2)

## 2013-01-07 LAB — BASIC METABOLIC PANEL
BUN: 15 mg/dL (ref 7–18)
Co2: 35 mmol/L — ABNORMAL HIGH (ref 21–32)
Creatinine: 0.49 mg/dL — ABNORMAL LOW (ref 0.60–1.30)
Glucose: 99 mg/dL (ref 65–99)
Osmolality: 277 (ref 275–301)

## 2013-01-07 LAB — CBC CANCER CENTER
Basophil #: 0 x10 3/mm (ref 0.0–0.1)
Eosinophil #: 0 x10 3/mm (ref 0.0–0.7)
Eosinophil %: 0.3 %
Lymphocyte #: 0.6 x10 3/mm — ABNORMAL LOW (ref 1.0–3.6)
Lymphocyte %: 8.5 %
MCV: 81 fL (ref 80–100)
Monocyte #: 0.6 x10 3/mm (ref 0.2–1.0)
Neutrophil #: 5.6 x10 3/mm (ref 1.4–6.5)
Neutrophil %: 82.2 %
Platelet: 308 x10 3/mm (ref 150–440)
RBC: 4.07 10*6/uL — ABNORMAL LOW (ref 4.40–5.90)
RDW: 14.1 % (ref 11.5–14.5)

## 2013-01-08 ENCOUNTER — Ambulatory Visit: Payer: Self-pay | Admitting: Oncology

## 2013-01-11 ENCOUNTER — Ambulatory Visit: Payer: Self-pay | Admitting: Oncology

## 2013-01-14 ENCOUNTER — Ambulatory Visit: Payer: Self-pay | Admitting: Oncology

## 2013-01-14 LAB — CBC CANCER CENTER
Basophil %: 0.6 %
Eosinophil #: 0.2 x10 3/mm (ref 0.0–0.7)
Eosinophil %: 4 %
HCT: 33.1 % — ABNORMAL LOW (ref 40.0–52.0)
Lymphocyte #: 0.8 x10 3/mm — ABNORMAL LOW (ref 1.0–3.6)
Lymphocyte %: 18.6 %
MCH: 26.3 pg (ref 26.0–34.0)
MCHC: 32.2 g/dL (ref 32.0–36.0)
MCV: 82 fL (ref 80–100)
Monocyte #: 0.6 x10 3/mm (ref 0.2–1.0)
Monocyte %: 12.9 %
Neutrophil #: 2.8 x10 3/mm (ref 1.4–6.5)
Neutrophil %: 63.9 %

## 2013-01-14 LAB — BASIC METABOLIC PANEL
Anion Gap: 5 — ABNORMAL LOW (ref 7–16)
BUN: 13 mg/dL (ref 7–18)
Chloride: 99 mmol/L (ref 98–107)
Creatinine: 0.57 mg/dL — ABNORMAL LOW (ref 0.60–1.30)
EGFR (African American): >60
EGFR (Non-African Amer.): >60

## 2013-01-21 LAB — COMPREHENSIVE METABOLIC PANEL
Albumin: 3 g/dL — ABNORMAL LOW (ref 3.4–5.0)
Alkaline Phosphatase: 122 U/L (ref 50–136)
BUN: 11 mg/dL (ref 7–18)
Bilirubin,Total: 0.6 mg/dL (ref 0.2–1.0)
Chloride: 93 mmol/L — ABNORMAL LOW (ref 98–107)
EGFR (Non-African Amer.): >60
Glucose: 86 mg/dL (ref 65–99)
Osmolality: 260 (ref 275–301)
SGOT(AST): 40 U/L — ABNORMAL HIGH (ref 15–37)
SGPT (ALT): 43 U/L (ref 12–78)
Sodium: 130 mmol/L — ABNORMAL LOW (ref 136–145)
Total Protein: 8.3 g/dL — ABNORMAL HIGH (ref 6.4–8.2)

## 2013-01-21 LAB — CBC
HCT: 35.9 % — ABNORMAL LOW (ref 40.0–52.0)
HGB: 11.9 g/dL — ABNORMAL LOW (ref 13.0–18.0)
MCH: 27.3 pg (ref 26.0–34.0)
MCV: 82 fL (ref 80–100)
Platelet: 294 10*3/uL (ref 150–440)
RBC: 4.36 10*6/uL — ABNORMAL LOW (ref 4.40–5.90)
RDW: 14.2 % (ref 11.5–14.5)
WBC: 7.6 10*3/uL (ref 3.8–10.6)

## 2013-01-22 ENCOUNTER — Inpatient Hospital Stay: Payer: Self-pay | Admitting: Internal Medicine

## 2013-01-22 LAB — URINALYSIS, COMPLETE
Bacteria: NONE SEEN
Blood: NEGATIVE
Leukocyte Esterase: NEGATIVE
Nitrite: NEGATIVE
RBC,UR: 6 /HPF (ref 0–5)
Specific Gravity: 1.044 (ref 1.003–1.030)
Squamous Epithelial: NONE SEEN
WBC UR: 4 /HPF (ref 0–5)

## 2013-01-23 LAB — CBC WITH DIFFERENTIAL/PLATELET
Eosinophil #: 0 10*3/uL (ref 0.0–0.7)
Eosinophil %: 0 %
HCT: 33.9 % — ABNORMAL LOW (ref 40.0–52.0)
HGB: 10.5 g/dL — ABNORMAL LOW (ref 13.0–18.0)
Lymphocyte #: 0.3 10*3/uL — ABNORMAL LOW (ref 1.0–3.6)
MCH: 25.5 pg — ABNORMAL LOW (ref 26.0–34.0)
MCHC: 31.1 g/dL — ABNORMAL LOW (ref 32.0–36.0)
Monocyte %: 3.6 %
Neutrophil #: 8.2 10*3/uL — ABNORMAL HIGH (ref 1.4–6.5)
Neutrophil %: 93.4 %
Platelet: 353 10*3/uL (ref 150–440)
RBC: 4.14 10*6/uL — ABNORMAL LOW (ref 4.40–5.90)

## 2013-01-23 LAB — BASIC METABOLIC PANEL
Anion Gap: 5 — ABNORMAL LOW (ref 7–16)
BUN: 15 mg/dL (ref 7–18)
Calcium, Total: 9.3 mg/dL (ref 8.5–10.1)
Chloride: 95 mmol/L — ABNORMAL LOW (ref 98–107)
Co2: 33 mmol/L — ABNORMAL HIGH (ref 21–32)
Creatinine: 0.6 mg/dL (ref 0.60–1.30)
EGFR (African American): >60
Osmolality: 268 (ref 275–301)
Potassium: 3.9 mmol/L (ref 3.5–5.1)
Sodium: 133 mmol/L — ABNORMAL LOW (ref 136–145)

## 2013-01-24 LAB — BASIC METABOLIC PANEL
BUN: 14 mg/dL (ref 7–18)
Co2: 30 mmol/L (ref 21–32)
Creatinine: 0.53 mg/dL — ABNORMAL LOW (ref 0.60–1.30)
EGFR (African American): >60
Glucose: 150 mg/dL — ABNORMAL HIGH (ref 65–99)
Osmolality: 266 (ref 275–301)
Potassium: 3.8 mmol/L (ref 3.5–5.1)
Sodium: 131 mmol/L — ABNORMAL LOW (ref 136–145)

## 2013-01-24 LAB — VANCOMYCIN, TROUGH: Vancomycin, Trough: 17 ug/mL (ref 10–20)

## 2013-02-04 LAB — COMPREHENSIVE METABOLIC PANEL
Anion Gap: 1 — ABNORMAL LOW (ref 7–16)
Calcium, Total: 10.3 mg/dL — ABNORMAL HIGH (ref 8.5–10.1)
Creatinine: 0.46 mg/dL — ABNORMAL LOW (ref 0.60–1.30)
Potassium: 4.2 mmol/L (ref 3.5–5.1)
SGOT(AST): 55 U/L — ABNORMAL HIGH (ref 15–37)
SGPT (ALT): 65 U/L (ref 12–78)
Sodium: 136 mmol/L (ref 136–145)

## 2013-02-04 LAB — CBC CANCER CENTER
Basophil #: 0 x10 3/mm (ref 0.0–0.1)
Basophil %: 0.5 %
Eosinophil #: 0 x10 3/mm (ref 0.0–0.7)
HGB: 10.9 g/dL — ABNORMAL LOW (ref 13.0–18.0)
MCHC: 32.1 g/dL (ref 32.0–36.0)
Monocyte %: 16.5 %
Neutrophil #: 3.9 x10 3/mm (ref 1.4–6.5)
Neutrophil %: 69.9 %
Platelet: 286 x10 3/mm (ref 150–440)
RDW: 15.8 % — ABNORMAL HIGH (ref 11.5–14.5)

## 2013-02-05 ENCOUNTER — Ambulatory Visit: Payer: Self-pay | Admitting: Oncology

## 2013-02-05 ENCOUNTER — Emergency Department: Payer: Self-pay | Admitting: Emergency Medicine

## 2013-02-11 ENCOUNTER — Emergency Department: Payer: Self-pay | Admitting: Emergency Medicine

## 2013-02-11 LAB — COMPREHENSIVE METABOLIC PANEL
Albumin: 2.5 g/dL — ABNORMAL LOW (ref 3.4–5.0)
Anion Gap: 3 — ABNORMAL LOW (ref 7–16)
BUN: 12 mg/dL (ref 7–18)
Bilirubin,Total: 0.8 mg/dL (ref 0.2–1.0)
Calcium, Total: 8.7 mg/dL (ref 8.5–10.1)
Chloride: 92 mmol/L — ABNORMAL LOW (ref 98–107)
Creatinine: 0.67 mg/dL (ref 0.60–1.30)
EGFR (Non-African Amer.): >60
Osmolality: 265 (ref 275–301)
SGOT(AST): 56 U/L — ABNORMAL HIGH (ref 15–37)
Sodium: 131 mmol/L — ABNORMAL LOW (ref 136–145)
Total Protein: 7 g/dL (ref 6.4–8.2)

## 2013-02-11 LAB — CBC CANCER CENTER
Basophil #: 0 x10 3/mm (ref 0.0–0.1)
Basophil %: 0.3 %
Eosinophil %: 0.1 %
HCT: 31.4 % — ABNORMAL LOW (ref 40.0–52.0)
Lymphocyte #: 0.4 x10 3/mm — ABNORMAL LOW (ref 1.0–3.6)
Lymphocyte %: 9.4 %
MCH: 26.9 pg (ref 26.0–34.0)
MCHC: 31.9 g/dL — ABNORMAL LOW (ref 32.0–36.0)
MCV: 84 fL (ref 80–100)
Monocyte %: 14.9 %
Neutrophil #: 3 x10 3/mm (ref 1.4–6.5)
Neutrophil %: 75.3 %
RDW: 14.9 % — ABNORMAL HIGH (ref 11.5–14.5)
WBC: 4 x10 3/mm (ref 3.8–10.6)

## 2013-02-20 ENCOUNTER — Emergency Department: Payer: Self-pay | Admitting: Emergency Medicine

## 2013-03-08 ENCOUNTER — Ambulatory Visit: Payer: Self-pay | Admitting: Oncology

## 2013-04-07 ENCOUNTER — Ambulatory Visit: Payer: Self-pay | Admitting: Oncology

## 2013-04-07 DEATH — deceased

## 2013-12-19 IMAGING — CR DG CHEST 1V PORT
1 series · 1 of 1 positions shown · non-contrast
Comparison: none

REASON FOR EXAM: Chest Pain
COMMENTS:

[ap]
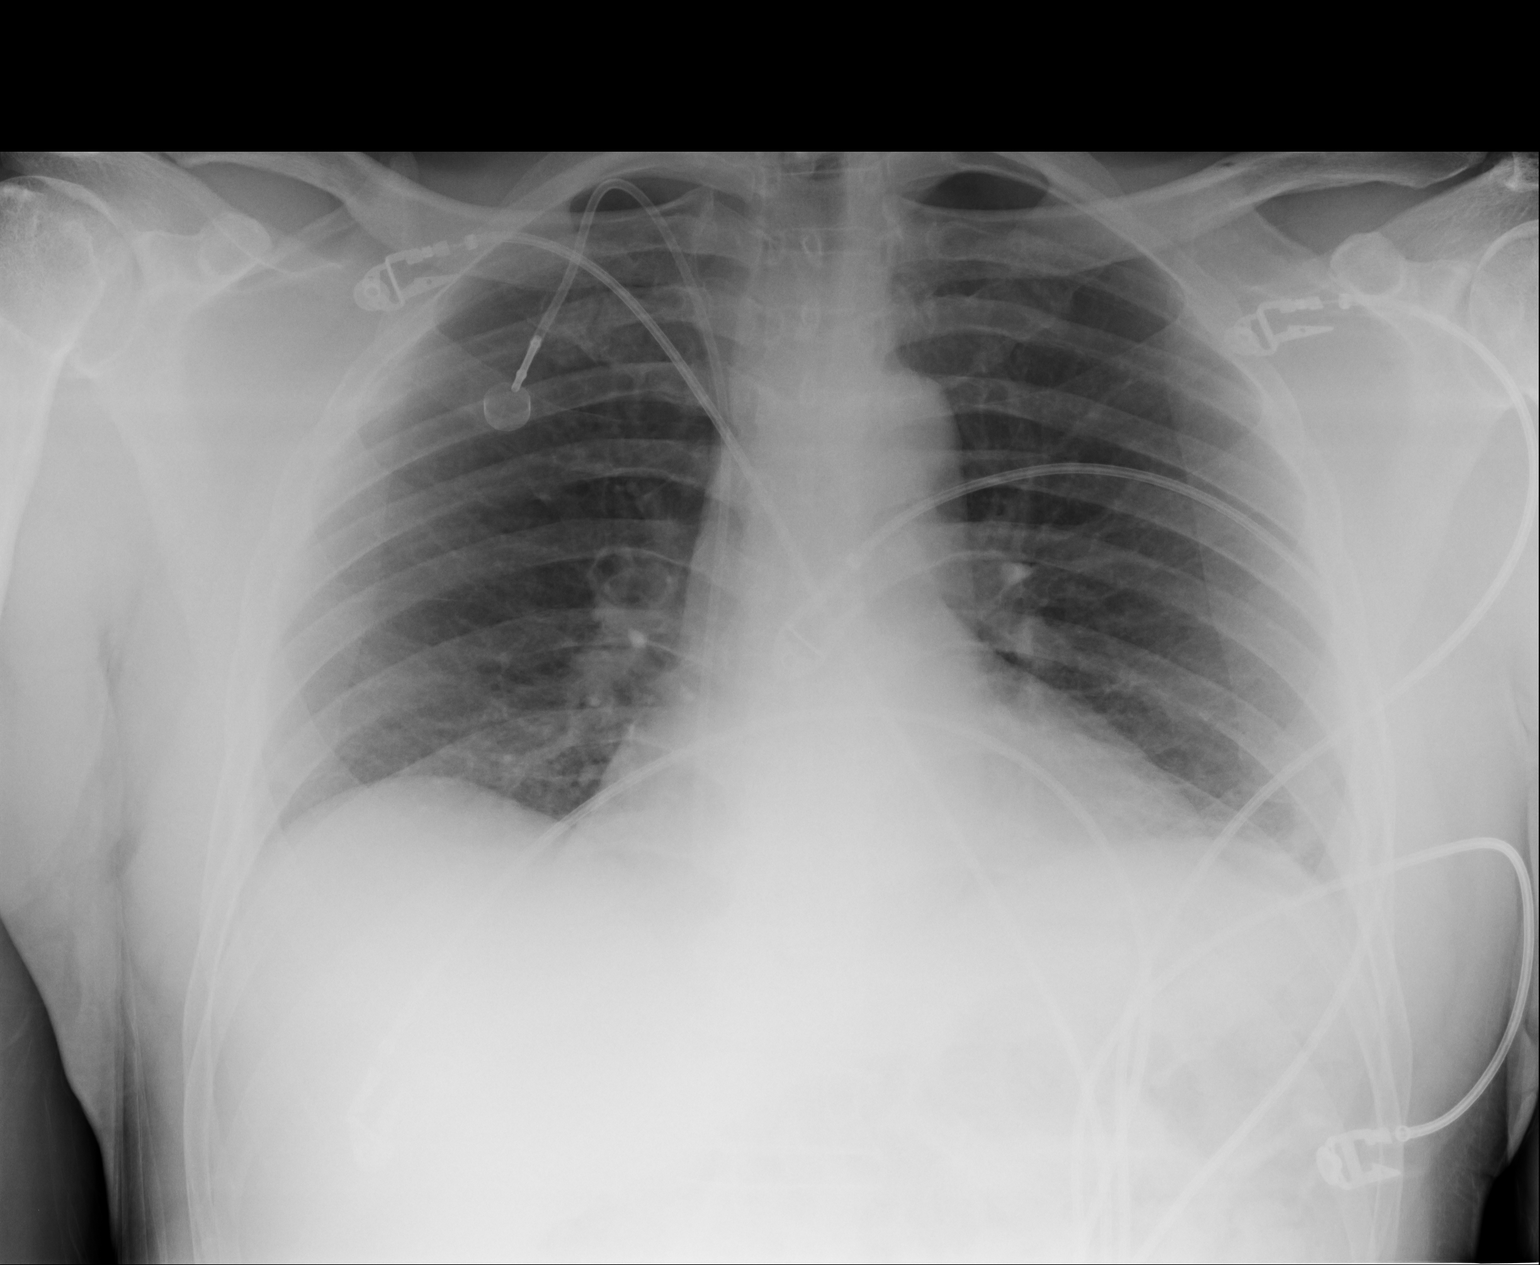

[1 of 1 positions shown; findings below may reference images not displayed]

PROCEDURE:     DXR - DXR PORTABLE CHEST SINGLE VIEW  - March 03, 2012 [DATE]

RESULT:     Comparison is made to the prior exam of 06/23/2009. The
inspiratory level is less than optimal. The lung fields are clear. No
pneumonia, pneumothorax or pleural effusion is seen. A Port-A-Cath is
present with the tip projected over the right atrium. Monitoring electrodes
are present.
IMPRESSION: 1.     No acute changes are identified.

## 2014-04-23 IMAGING — CT CT SIM MISC
1 series · 12 of 14 positions shown, 15 images · non-contrast
Comparison: none

[Series 3: — · axial · 1.14mm/px · z∈[-790,-460]mm · 12 of 131 slices shown, 15 images]
[im 11/131  soft-tissue]
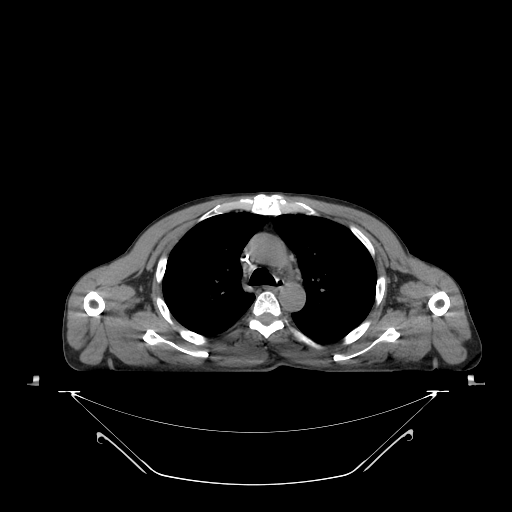
[im 11/131  bone]
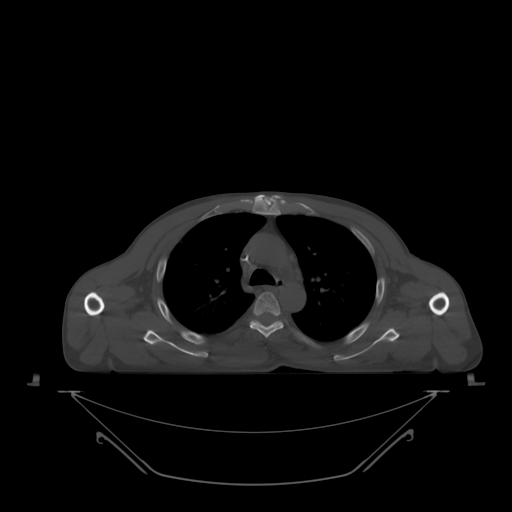
[im 21/131  bone]
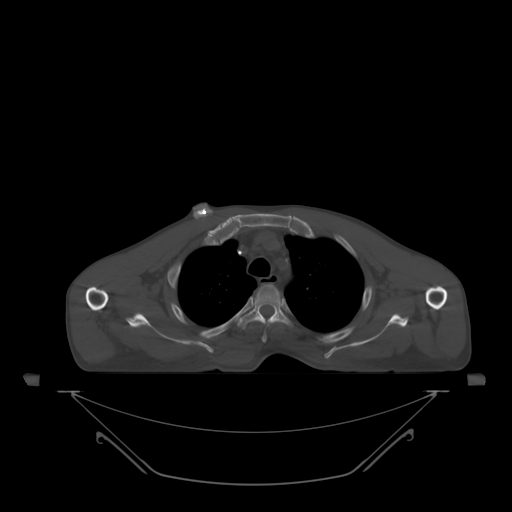
[im 31/131  bone]
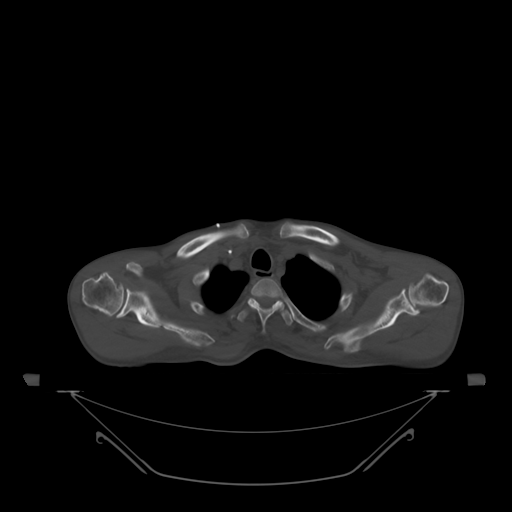
[im 41/131  bone]
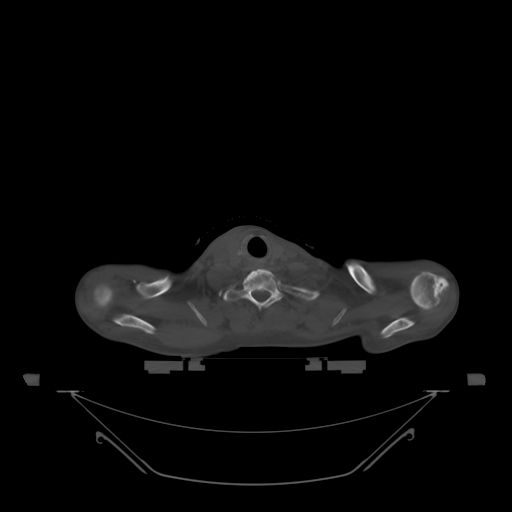
[im 51/131  soft-tissue]
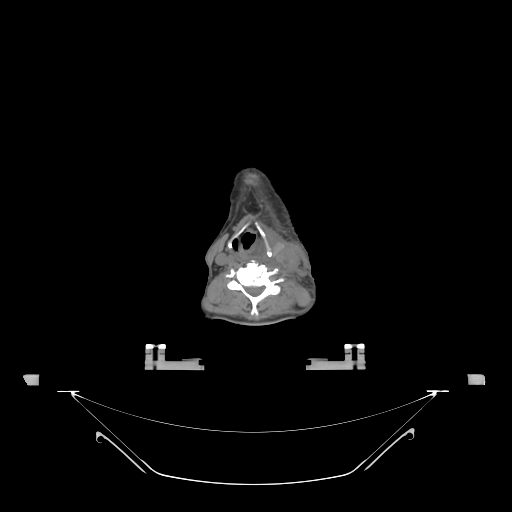
[im 51/131  bone]
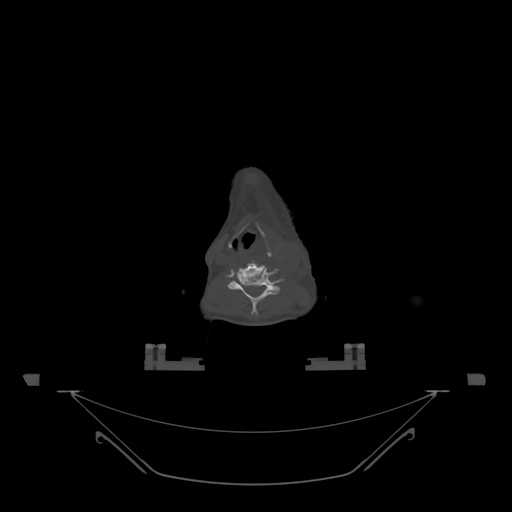
[im 61/131  bone]
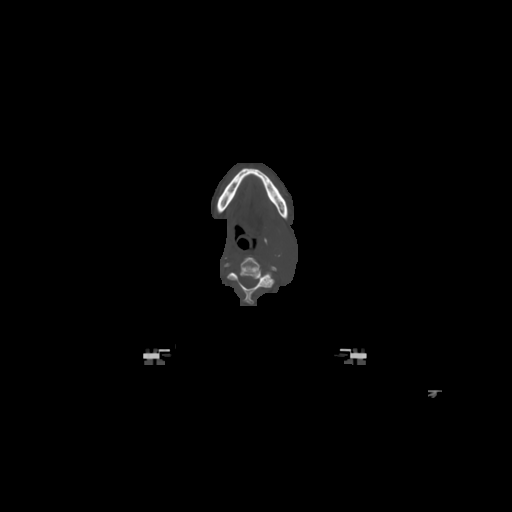
[im 71/131  bone]
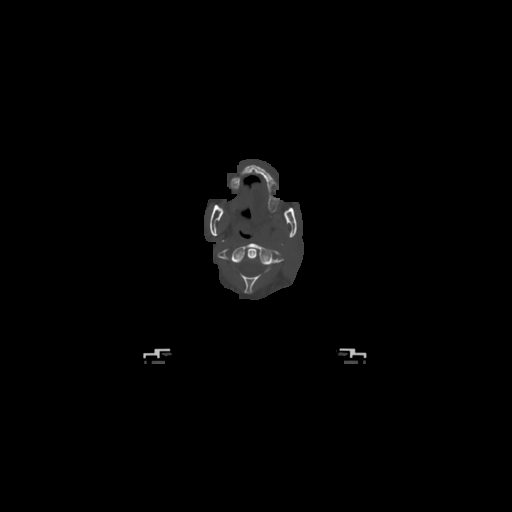
[im 81/131  bone]
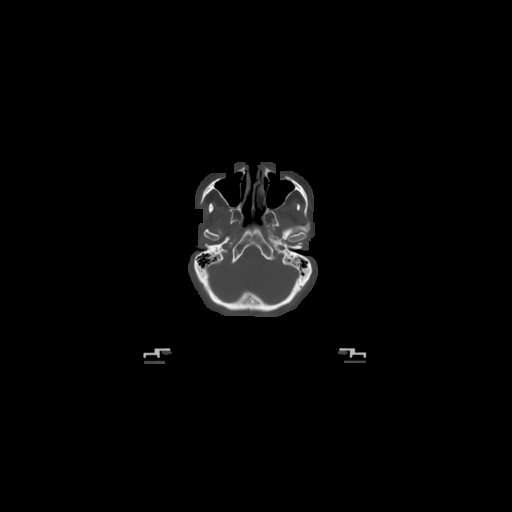
[im 91/131  soft-tissue]
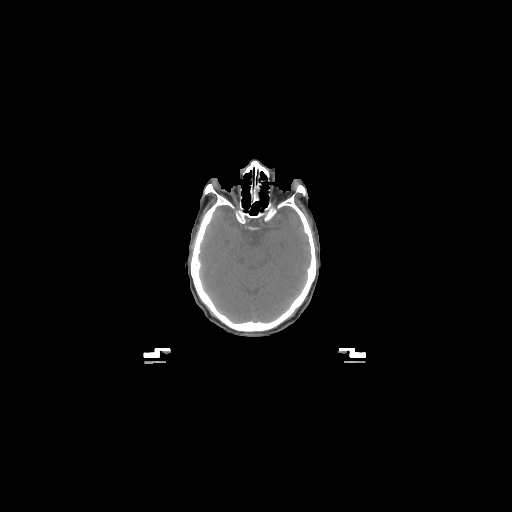
[im 91/131  bone]
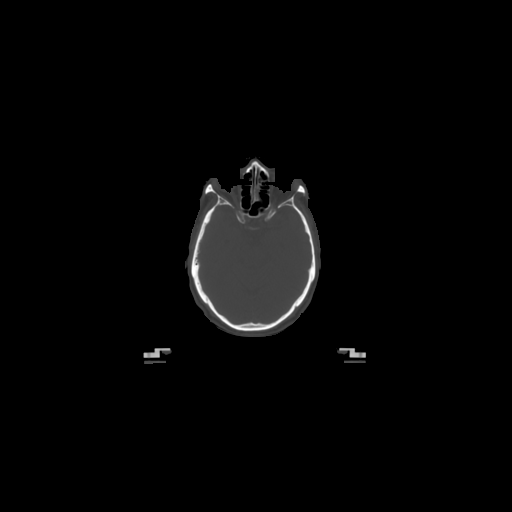
[im 101/131  bone]
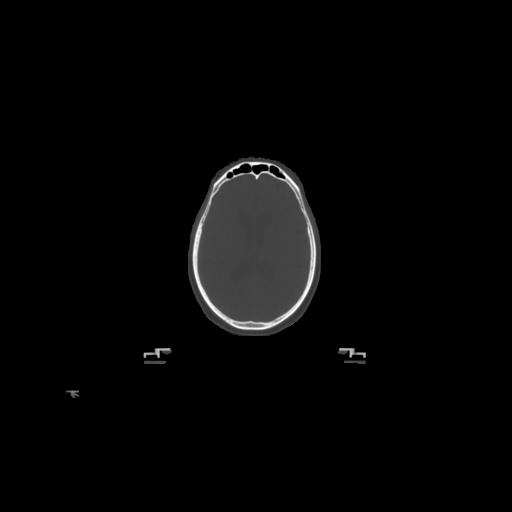
[im 111/131  bone]
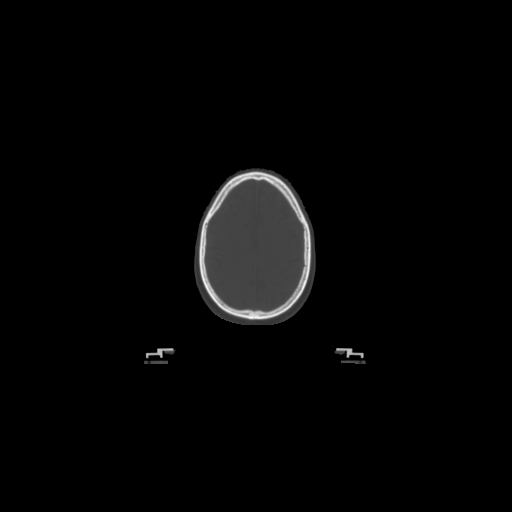
[im 121/131  bone]
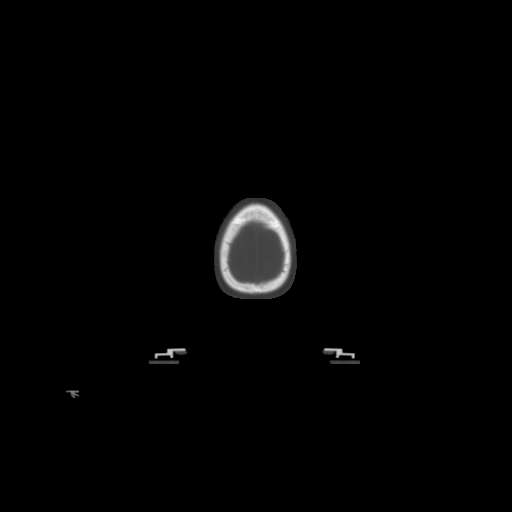

[12 of 14 positions shown; findings below may reference images not displayed]

IMAGES IMPORTED FROM THE [employer] WORKFLOW SYSTEM
NO DICTATION FOR STUDY

## 2014-05-30 IMAGING — CR DG CHEST 1V PORT
1 series · 1 of 1 positions shown · non-contrast
Comparison: none

REASON FOR EXAM: vomiting, evaluate for aspiration pneumonia
COMMENTS:

[ap]
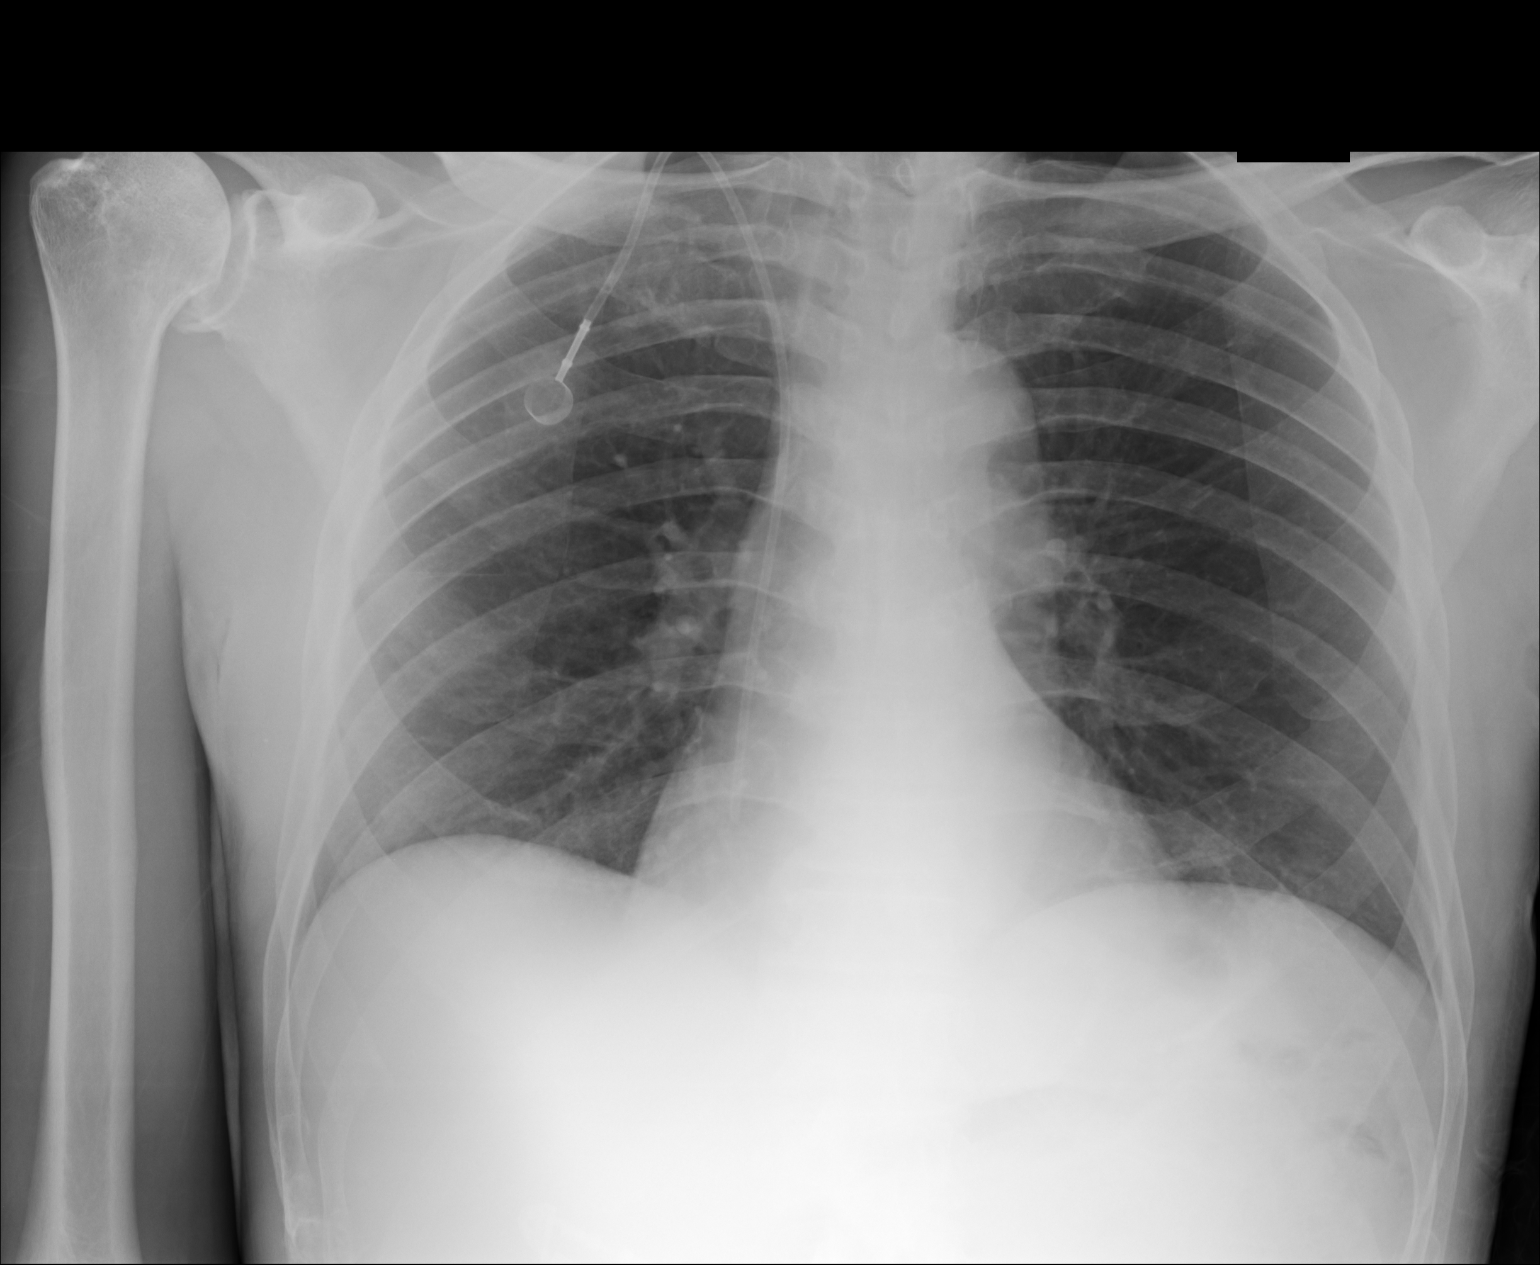

[1 of 1 positions shown; findings below may reference images not displayed]

PROCEDURE:     DXR - DXR PORTABLE CHEST SINGLE VIEW  - August 12, 2012  [DATE]

RESULT:     Comparison is made to the study 24 July, 2012.

Port-A-Cath device remains in place with the tip of the catheter in the
right atrium. The cardiac silhouette is normal. The lungs appear to be
clear. There is no edema, effusion, mass or pneumothorax.
IMPRESSION: 1. Port-A-Cath device remains in place. No acute cardiopulmonary disease
evident.

[REDACTED]

## 2014-10-29 IMAGING — CT NM PET TUM IMG RESTAG (PS) SKULL BASE T - THIGH
1 of 5 series · 1 of 25 positions shown · non-contrast
Comparison: none

REASON FOR EXAM: head neck CA restaging
COMMENTS:

PROCEDURE:     PET - PET/CT RESTG HEAD/NECK CA  - January 11, 2013 [DATE]
RESULT:     History: Head neck cancer.
Comparison Study: Prior PET/CT of 11/11/2012.

[Series 3: ct headneck 2.0 b31s · axial · 2.0mm · 0.98mm/px · 1 of 188 slices shown]
[im 134/188  brain]
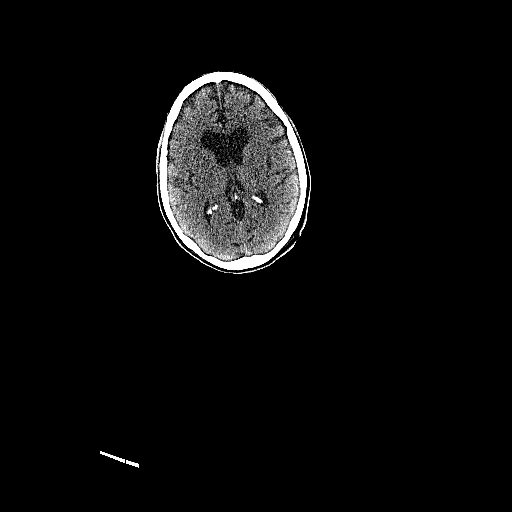

[1 of 25 positions shown; findings below may reference images not displayed]

FINDINGS: Following determination of fasting blood sugar of 95 mg/dL
millicuries of F-18 FDG administered. CT obtained for attenuation correction
and fusion. Again noted is a large soft tissue mass at the angle of the
mandible normal left. This is diminished in size on CT to 3.5 cm in
transverse diameter x 6.4 cm in AP diameter. SUV levels have diminished lung
approximately 6.1 Max 25.5 Max. PET positive nodule in the posterior left
neck at this level has resolved. Large PET positive infiltrates appear
segment of the left lower lobe an lower portion of the left lower lobe has
partially resolved maximum SUV levels in this region now noted to be 4.2.
Left lower rib activity has resolved significantly. Gastrostomy tube noted.
Mild activity noted a gastrostomy tube.
IMPRESSION: Decreased tumor bulk and FDG uptake left neck. Decreased
tumor/infiltrate ball and FDG uptake left lower pulmonary lobe a left lower
ribs.

## 2015-03-27 NOTE — Consult Note (Signed)
PATIENT NAME:  Spencer Johnson, Spencer Johnson MR#:  [id number] DATE OF BIRTH:  [birth year]  DATE OF CONSULTATION:  07/26/2012  REFERRING PHYSICIAN:  Dr. Imogene Burn  CONSULTING PHYSICIAN:  Rosalyn Gess. Cillian Gwinner, MD  REASON FOR CONSULTATION: Fever and leukocytosis.   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 The patient had been brought to the Emergency Room and by the time he got to the Emergency Room he was awake and alert. He had a low-grade fever of just over 101 in the Emergency Room. He had urine and blood cultures which have been negative to date. He was started on Zosyn and vancomycin. He does not recall many of the events leading up to his hospitalization but believes he had a seizure at home. He has had seizures in the past. He denies any current chills or sweats. He is overall feeling well except for pain at the IV site in his left arm. He is a relatively poor historian but denied any current pain other than his arm. He has no current nausea, vomiting, or change in his bowels.   ALLERGIES: NSAIDs.   PAST MEDICAL HISTORY:  1. Stage IV throat cancer, status post chemotherapy and XRT.  2. Seizure disorder. 3. Subdural hematoma, status post surgical decompression.  4. Bipolar disorder.  5. Polysubstance abuse.  6. Osteoarthritis.  7. Peptic ulcer disease.  8. Gastroesophageal reflux disease.   SOCIAL HISTORY: The patient lives in a nursing home. He has a prior history of smoking and drinking but does not currently smoke nor drink.   FAMILY HISTORY: Positive for stroke and cancer.   REVIEW OF SYSTEMS: He is a poor historian but he denied any fevers, chills, or sweats at home. He has had some low-grade fever in the hospital under 101.5. No malaise. HEENT: He  does have some headaches. No sinus congestion. No sore throat. NECK: He does have some neck stiffness but no swollen glands. RESPIRATORY: No cough, no shortness of breath, no sputum production. CARDIAC: No chest pains or palpitations. GI: No nausea, no vomiting, no abdominal pain, no change in his bowels. GU: No change in his urine. MUSCULOSKELETAL: No complaints. SKIN: No rashes although he does complain of pain at the IV site but no redness or drainage. NEUROLOGIC: No focal weakness. Positive seizures. PSYCHIATRIC: No complaints. All other systems are negative.   PHYSICAL EXAMINATION:   VITAL SIGNS: T-max of 100.8 although apparently he was just over 101 in the Emergency Room, T-current 99.9, pulse 99, blood pressure 120/78, 98% on room air.   GENERAL: 64 year old black man in no acute distress.   HEENT: Normocephalic, atraumatic. Pupils equal and reactive to light. Extraocular motion appeared to be intact. Oropharynx the patient had poor dentition.  NECK: He had some deformities around the neck but no open ulcerations. Midline trachea. No lymphadenopathy. No thyromegaly.   CHEST: Clear to auscultation bilaterally with good air movement. No focal consolidation. There was a port in the right side of the chest that was accessed and did not appear to be tender or erythematous.   CARDIAC: Regular rate and rhythm without murmur, rub, or gallop.   ABDOMEN: Soft, nontender, nondistended. No hepatosplenomegaly. No hernia is noted. He had a PEG tube in place that did not appear to be tender nor erythematous.  EXTREMITIES: No evidence for tenosynovitis.   SKIN: No rashes. No stigmata of endocarditis, specifically no Janeway lesions nor Osler nodes.   NEUROLOGIC: The patient was awake and interactive. He was a somewhat difficult historian. He was moving all four extremities.    PSYCHIATRIC: Mood and affect appeared normal.   SKIN: No rashes. No stigmata of endocarditis, specifically no Janeway  lesions or Osler nodes.   LABORATORY DATA: BUN 26, creatinine 0.8, bicarbonate 27, anion gap 7. LFTs were unremarkable. White count 13.8 with hemoglobin 8.5, platelet count 450, ANC 11.9. White count on admission was 14.6. White count on August 12th was 9.9. Blood cultures from admission show no growth. Urinalysis was unremarkable. Urine culture is growing mixed bacterial flora.   A chest x-ray showed no acute infiltrates.   A CT scan of the head without contrast showed no acute intracranial process. There was a soft tissue mass just inferior to the left mastoid sinus most consistent with malignancy that was unchanged from a prior PET CT scan.   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   RECOMMENDATIONS:  1. His cultures have been negative so far. He has not had any recorded temperatures over 101.5. It is possible that his recent seizure caused low-grade temperature elevations and leukocytosis. 2. We would continue the current antibiotics for a day.  3. Will follow his cultures and his white count and temperature curve.  4. If his cultures remain negative, then will consider stopping the antibiotics.   This is a moderately complex Infectious Disease case. Thank you very much for involving me in [name]' care.  ____________________________ Rosalyn GessMichael E. Marquese Burkland, MD meb:drc D: 07/26/2012 16:21:37 ET T: 07/27/2012 09:08:50 ET JOB#: [id number]  cc: Rosalyn GessMichael E. Cammie Faulstich, MD, <Dictator> Kenley Troop E Lorree Millar MD ELECTRONICALLY SIGNED 07/30/2012 15:11

## 2015-03-27 NOTE — Consult Note (Signed)
Reason for Visit: This 64 year old Male patient presents to the clinic for initial evaluation of  Recurrent head and neck cancer .   Referred by Dr. Orlie DakinFinnegan.  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 Now with extensive recurrent disease in the left neck   Imaging Report PET/CT from May reviewed. 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 Initially his PET/CT showed a large mass in the left tonsillar region with PET CT confirmed bilateral adenopathy in the neck as well as large unresectable mass centered in the left tonsillar fossa. Biopsy was positive for squamous cell carcinoma. He underwent induction chemotherapy with cisplatin Taxotere and 5-FU.went on to have combined modality treatment with IMRT radiation therapy up to 7000 cGy.since that time he is to present [facility]. He is had a subdural hematoma necessitating [facility]. He was also noted at that time to have recurrent head and neck cancer causing dysphasia. A PEG tube was placed. He was placed on Dilantin and then switch to Keppra. PET scanning may confirm large avid recurrence in the region of the left neck. He has been having significant pain requiring narcotic analgesics. He is seen today for evaluation and is having increasing swelling and pain in his left neck. He also continues to have significant dysphasia.he is seen today for possibility of salvage treatment.  Past Hx:    Arthritis:    Gastric ulcers:    Throat cancer:    Bipolar  Disorder:   Past, Family and Social History:   Past Medical History positive    Neurological/Psychiatric Bipolar disorder    Family History noncontributory    Social History positive    Social History Comments Possibly 30-pack-year smoking history, no EtOH abuse history    Additional Past Medical and Surgical History Seen by himself today   Allergies:   No Known Allergies:   Home Meds:  Home Medications: Medication Instructions Status  Duragesic-25 25 mcg/hr film, extended release 1 PATCH transdermal every 72 hours x 30 days, As Needed Active  Reglan 5 mg oral tablet 1 tab(s) orally 4 times a day (before meals and at bedtime) via G tube Active  Colace 100 mg oral capsule 1 cap(s) orally once a day via G tube Active  famotidine 20 mg oral tablet 1 tab(s) orally once a day via G tube Active  Keppra 1000 mg oral tablet 1 tab(s) orally 2 times a day via G tube Active  simethicone 80 mg oral tablet 1 tab(s) orally every 6 hours, As Needed via G tube Active  Jevity 160cc Bolus TF  every 4 hours Active  Water Flushes 200ml   every 4 hoursfor tube patency and hydration Active   Review of Systems:   General negative    Performance Status (ECOG) 1    Skin see HPI    Breast negative    Ophthalmologic negative    ENMT see HPI    Respiratory and Thorax negative    Cardiovascular negative  Gastrointestinal negative    Genitourinary negative    Musculoskeletal negative    Neurological see HPI    Psychiatric negative    Hematology/Lymphatics negative    Endocrine negative    Allergic/Immunologic negative   Nursing Notes:  Nursing Vital Signs and Chemo Nursing Nursing Notes: *CC Vital Signs Flowsheet:   29-Jul-13 09:45   Temp Temperature 98   Pulse Pulse 109   Respirations Respirations 20   SBP SBP 118   DBP DBP 85   Pain Scale (0-10)  10   Pulse Oxi  97   Current Weight (kg) (kg) 66.3   Height (cm) centimeters 180.3   BSA (m2) 1.8    10:48   Temp  Temperature 98   Pulse Pulse 109   Respirations Respirations 18   SBP SBP 118   DBP DBP 85   Pain Scale (0-10)  10   Current Weight (kg) (kg) 66.3   Height (cm) centimeters 180.3   BSA (m2) 1.8   Physical Exam:  General/Skin/HEENT:   Additional PE Well-developed male. He has marked swelling left side of his face enlarged bulky confluent adenopathy in the left neck. Oral cavity shows teeth in poor state of repair he has significant trismus making indirect mirror examination was impossible. Lungs are clear to A&P cardiac examination shows regular rate and rhythm.   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 We will try to eradicate residual disease with 6000 cGy over 6 weeks using concurrent chemotherapy. Would plan IMRT radiation therapy to spare pre-irradiated areas and his salivary glands the best of our ability. We will focus on the PET avid area only and spare remainder of his neck structures. Risks and benefits of treatment including increasing dysphasia skin reaction and previous treatment of already irradiated tissue was all described in detail with the patient. I have set him up on a with CT simulation tomorrow. I will coordinate his chemotherapy with Dr. Orlie Dakin staff.  I would like to take this opportunity to thank you for allowing me to continue to participate in this patient's care.  CC Referral:   cc: Dr. Gertie Baron, Dr. Angus Palms   Electronic Signatures: Rushie Chestnut Gordy Councilman (MD)  (Signed 29-Jul-13 14:37)  Authored: HPI, Diagnosis, Past Hx, PFSH, Allergies, Home Meds, ROS, Nursing Notes, Physical Exam,  Encounter Assessment and Plan, CC Referring Physician   Last Updated: 29-Jul-13 14:37 by Rebeca Alert (MD)

## 2015-03-27 NOTE — H&P (Signed)
PATIENT NAME:  Spencer Johnson, Spencer Johnson MR#:  [id number] DATE OF BIRTH:  12/11/1950  DATE OF ADMISSION:  07/24/2012  PRIMARY CARE PHYSICIAN: Spencer PalmsSionne George, MD    CHIEF COMPLAINT: Unresponsiveness and development of fever.   HISTORY OF PRESENT ILLNESS: Mr. Spencer Johnson is a 64 year old African American male with stage IV throat cancer. He is DO NOT RESUSCITATE status, resides at a nursing home. He was transferred by EMS to the hospital for evaluation of unresponsiveness. The patient also was found here to be febrile. The source of infection or fever was not identified. While he was here, he started to wake up and he became responsive and back to his normal state. Work-up for his fever was negative, including CAT scan of the head, chest x-ray, urinalysis, and blood work-up. Blood cultures were taken, and the patient was started on two antibiotics using Zosyn and vancomycin pending results of the cultures. I asked the patient if he knows what happened to him during unresponsiveness. He tells me that he had seizure activity. Indeed, reviewing his records from [place], [place], he does have history of seizure activity; although prior EEG was reported to be normal. However, I do not have explanation for his fever and the slight elevation of white blood cell count.    REVIEW OF SYSTEMS: CONSTITUTIONAL: The patient does have fever. No chills. No fatigue. EYES: No blurring of vision. No double vision. ENT: No hearing impairment. He has pain in his throat, and also he tells me he had some pain in his left ear as well. He has dysphagia and at the time being he is unable to swallow or eat. CARDIOVASCULAR: No chest pain. No shortness of breath. He had syncope during unresponsiveness. RESPIRATORY: No cough. No chest pain. No shortness of breath. GASTROINTESTINAL: No abdominal pain. No vomiting. No diarrhea. No hematochezia. No melena. GENITOURINARY: No dysuria or frequency of urination. MUSCULOSKELETAL: No joint pain or swelling. No  muscular pain or swelling. INTEGUMENTARY: No skin rash. No ulcers. NEUROLOGY: No focal weakness. He does have seizure activity. He denies headache. PSYCHIATRY: No anxiety. No depression. ENDOCRINE: No polyuria or polydipsia. No heat or cold intolerance.   PAST MEDICAL HISTORY:  1. Stage IVA tonsillar cancer, or head and neck cancer with recurrence. He was started earlier this month on cisplatin.  He also underwent radiation therapy. The patient is status post PEG tube insertion since he is unable to eat or swallow.  2. History of subdural hematoma, status post bur hole.  3. History of seizures with reported normal electroencephalogram.  4. Bipolar disorder.  5. History of polysubstance abuse, including tobacco and alcohol.  6. History of chronic hyponatremia.  7. Osteoarthritis.  8. Peptic ulcer disease.  9. Gastroesophageal reflux disease.  10. Anemia of chronic disease.   PAST SURGICAL HISTORY:  1. PEG tube insertion.  2. History of bur hole formation for treatment of subdural hematoma.   SOCIAL HABITS: Ex-chronic smoker and ex-alcoholic.   SOCIAL HISTORY: The patient is now residing at a nursing home. He is DO NOT RESUSCITATE status.   FAMILY HISTORY: He reports that his mother died from cancer, but he does not know the type or location. His father died from stroke.   ADMISSION MEDICATIONS:  1. Docusate 10 mg in 1 mL oral liquid via G-tube once a day for constipation.  2. Famotidine or Pepcid 40 mg in 5 mL, taking 2.5 mL once a day via G-tube.  3. Fentanyl patch 100 mcg every 72 hours.  4. Levetiracetam  1000 mg twice a day via G-tube.  5. Magnesium hydroxide 15 mL via G-tube p.r.n. for constipation.  6. Metoclopramide or Reglan 5 mg via G-tube 4 times a day.  7. Oxycodone 10 mg tablet every 6 hours p.r.n. for pain.  8. Simethicone 80 mg chewable via G-tube every 6 hours p.r.n. for gas and indigestion .  9. TwoCal HN liquid 237 mL cans enteral tube feeding 4 times a day.   10. Tylenol p.r.n.  11. Water flushes using 160 mL 4 times a day.   ALLERGIES: Nonsteroidal anti-inflammatory medications given history of peptic ulcer disease.    PHYSICAL EXAMINATION:  VITAL SIGNS: Blood pressure 98/57, respiratory rate 12, pulse 77, temperature 101.1, oxygen saturation 92%.   GENERAL APPEARANCE: Elderly male lying in bed. He is looking older than his stated age. He is in no acute distress.   HEENT: Head: No pallor no icterus. No cyanosis. ENT: Hearing was normal. Nasal mucosa, lips, tongue were normal, although I could not evaluate the oral cavity adequately since he is unable to open his mouth sufficient enough for exam. He has a tumor palpated more on the left side of the neck. EYES: Normal iris and conjunctiva. Pupils about a few millimeters, pinpointed pupils, could not see reactivity to light.   NECK: Supple apart from the neck-invading tumor. No skin changes. No ulcerations.   HEART: Distant heart sounds. Faint S1 and S2. No murmur was appreciated. No carotid bruits.   RESPIRATORY: Normal breathing pattern without use of accessory muscles. No rales. No wheezing.   ABDOMEN: Soft without tenderness. No hepatosplenomegaly. No masses. No hernias.  PEG tube site is clean. He has a Port-A-Cath on the right upper chest that also looks clean.   SKIN: Examination revealed no ulcers, no subcutaneous nodules.   MUSCULOSKELETAL: No joint swelling. No clubbing.   NEUROLOGIC: Cranial nerves II through XII are intact. No focal motor deficit.   PSYCHIATRIC: The patient is alert, oriented to place and people. Mood and affect were flat.  LABORATORY, DIAGNOSTIC AND RADIOLOGICAL DATA: CAT scan of the head showed no acute intracranial abnormality. There was incomplete visualization of the soft tissue mass inferior to the left mastoid sinus consistent with his malignancy. This is similar to previous appearance on 06/24/2012. Chest x-ray showed no acute cardiopulmonary abnormality.  His EKG showed normal sinus rhythm at rate of 81 per minute. Unremarkable EKG. Serum glucose 103, BUN 16, creatinine 0.6, sodium 133. His sodium a week ago was 134.0. Potassium is 4.2. Calcium is 8.8. Liver function tests were normal except for slightly low albumin at 3.0. CBC showed elevated white blood cell count at 14,000, hemoglobin 9, hematocrit 30. platelet count 435. MCV, MCH and MCHC are low. Urinalysis was unremarkable. Arterial blood gas showed a pH of 7.43, pCO2 44, pO2 55.   ASSESSMENT:  1. Unresponsiveness: The patient improved by the time he was transported by ambulance to the hospital. The patient is fully alert. Likely what happened is he had seizure activity.  2. Fever associated with leukocytosis: The source is unclear sepsis. Sepsis is a consideration, but his chest x-ray is negative. Urinalysis is unremarkable. No other signs of infection. Other than sepsis, the other possibility is reaction to his seizure activity.  3. Mild leukocytosis.  4. Microcytic hypochromic anemia.  5. History of subdural hematoma and underwent bur hole procedure.  6. Past history of polysubstance abuse, including alcohol and tobacco.  7. The patient is status post PEG tube insertion since he is unable  to eat or swallow.  8. History of peptic ulcer disease.  9. Gastroesophageal reflux disease. 10. Osteoarthritis.  11. The patient is also status post Port-A-Cath catheterization insertion.   PLAN: Blood cultures x2 were taken and urine culture as well. The patient was started on Zosyn and vancomycin in the Emergency Department. I will not continue antibiotics for the time being and hold on this until the results of the blood cultures as the culprit could be his seizure. However, if he continues to spike temperature, we might change the approach. We will follow up on his neurologic examination every 2 hours, then every 4 hours to ensure stability while we will resume his antiseizure medication. Continue the  nursing home medications as listed above including the tube feeding.   CODE STATUS: The patient's CODE STATUS is DO NOT RESUSCITATE, and he has a formal paper signed by Dr. Ria Clock.    TIME SPENT: Time Spent evaluating this patient and reviewing his medical records from [facility] and [place],  and also prior admissions to this hospital, took more than 55 minutes.  ____________________________ Carney Corners. Rudene Re, MD amd:cbb D: 07/24/2012 23:10:57 ET T: 07/25/2012 09:41:43 ET JOB#: [id number] cc: Spencer Palms, MD Zollie Scale MD ELECTRONICALLY SIGNED 07/25/2012 22:18

## 2015-03-27 NOTE — H&P (Signed)
PATIENT NAME:  Spencer Johnson, Spencer Johnson MR#:  [id number] DATE OF BIRTH:  [birth year]  DATE OF ADMISSION:  07/24/2012  CORRECTION: The patients primary care physician is Dr. Toy CookeyErnest Eason. ____________________________ Letta MoynahanJonathan R. Kesley Gaffey, [place] jrp:cbb D: 07/24/2012 23:28:15 ET T: 07/25/2012 10:14:58 ET JOB#: [id number]  cc: Letta MoynahanJonathan R. Clyde CanterburyPrentice, [place], <Dictator>

## 2015-03-27 NOTE — Discharge Summary (Signed)
PATIENT NAME:  Spencer Johnson, Spencer Johnson MR#:  [id number] DATE OF BIRTH:  Apr 11, 1951  DATE OF ADMISSION:  07/24/2012 DATE OF DISCHARGE:  07/29/2012  PRIMARY CARE PHYSICIAN: Toy CookeyErnest Eason, MD    HOSPITAL CONSULTANTS:   1. Dr. Harvie JuniorPhifer. 2. Dr. Leavy CellaBlocker.   DISCHARGE DIAGNOSES:  1. Altered mental status possibly due to seizure activity.  2. Seizure activity and seizure disorder.  3. Leukocytosis, possibly due to reaction.  4. Anemia.   CODE STATUS: DO NOT RESUSCITATE.   MEDICATIONS: Please refer to the medications on [facility] Physician Discharge Instructions. Dr. Harvie JuniorPhifer suggested increase of fentanyl patch to 125 mcg every 3 days. In addition, she suggests morphine liquid 10 to 20 mg every 2 hours p.r.n. p.o.   DIET: PEG tube feeding.   ACTIVITY: As tolerated.   FOLLOW-UP CARE: Follow up with primary care physician within one week.   REASON FOR ADMISSION: Unresponsiveness and the development of fever.   HISTORY AND PHYSICAL: The patient is a 64 year old African American male with a history of stage IV throat cancer, subdural hematoma status post bur hole, history of seizure, bipolar disorder, substance abuse, presented to the ED with unresponsiveness and fever The patient was sent from a nursing home to the Emergency Department. When the patient was in the Emergency Department, the patient started to wake up and became responsive and back to his normal state. Work-up for his fever was negative, including CT scan of head, chest x-ray, urinalysis, and blood work. The patient was treated with Zosyn and vancomycin while in the Emergency Department and was admitted for altered mental status.  For detailed History and Physical Examination, please refer to the admission note dictated by Dr. Rudene Rearwish on admission.   LABORATORY, DIAGNOSTIC AND RADIOLOGICAL DATA: On admission date: Chest x-ray: No active cardiopulmonary abnormality. Unremarkable EKG. BUN 16, creatinine 0.6. WBC 14,000, hemoglobin 9.0. Urinalysis  unremarkable.   HOSPITAL COURSE: After admission, the patient has been placed on seizure precautions. Because the patient had a fever associated with leukocytosis, the patient was placed on antibiotics including vancomycin and Zosyn. Sepsis was a consideration, but chest x-ray, urinalysis, and blood culture all were negative, so sepsis was ruled out.  Dr. Leavy CellaBlocker evaluated the patient. He suggested to stop antibiotics due to no real source of infection. The fever and leukocytosis are possibly due to seizure activity. The patient had one episode of seizure-like activity yesterday and also altered mental status after the seizure; but the patient is alert, awake, oriented today and no complaints. The patient has been treated with Keppra for seizure. Electroencephalogram is nonspecific.   The patient also has macrocytic anemia. Hemoglobin has been stable. Since the patient has stage IV throat cancer and has left neck pain and facial pain, Dr. Harvie JuniorPhifer evaluated the patient and suggested to increase the fentanyl dose, hold Percocet, but gave him morphine 10 to 20 mg p.o. every 2 hours p.r.n.   The patient still has a fever of 99, but leukocytosis has improved. The patient is clinically stable and will be discharged back to a nursing home today Also, since the patient has a low-grade fever, the patient still needs to follow up with Dr. Leavy CellaBlocker as outpatient.   I discussed the patient's discharge plan with the case manager and Dr. Harvie JuniorPhifer.   TIME SPENT: About 45 minutes  ____________________________ Shaune PollackQing Volney Reierson, MD qc:cbb D: 07/29/2012 14:55:48 ET T: 07/29/2012 15:25:56 ET JOB#: [id number]  cc: Shaune PollackQing Nayshawn Mesta, MD, <Dictator> Serita ShellerErnest B. Maryellen PileEason, MD Shaune PollackQING Melyssa Signor MD ELECTRONICALLY SIGNED 08/03/2012 16:40

## 2015-03-27 NOTE — Consult Note (Signed)
Impression: 64yo BM w/ h/o stage IV throat CA, s/p chemo and XRT, subdural hematoma, s/p surgical decompression, seizure disorder and bipolar disorder admitted with a seizure who has low grade fever and leukocytosis.  His cultures have been negative so far.  He has not had any recorded temperature over 101.5.  It is possible that his recent seizure cause the low grade temp elevations and the leukocytosis. Continue current antibiotics for the next day.   Will follow his cultures. If his cultures remain negative, then will consider stopping the antibiotics.  Electronic Signatures: Laquanna Veazey, Rosalyn GessMichael E (MD)  (Signed on 19-Aug-13 16:14)  Authored  Last Updated: 19-Aug-13 16:14 by Acelin Ferdig, Rosalyn GessMichael E (MD)

## 2015-03-27 NOTE — H&P (Signed)
PATIENT NAME:  Spencer Johnson, Spencer Johnson MR#:  [id number] DATE OF BIRTH:  October 30, 1951  DATE OF ADMISSION:  07/24/2012  CORRECTION: The patient's primary care physician is Dr. Toy CookeyErnest Eason.  ____________________________ Spencer Johnson, Spencer Johnson  cc: Spencer CornersAmir M. Rudene Rearwish, Spencer, <Dictator> Serita ShellerErnest B. Maryellen PileEason, Spencer Zollie ScaleAMIR M Dorance Spink Spencer ELECTRONICALLY SIGNED 07/27/2012 2:39

## 2015-03-30 NOTE — H&P (Signed)
PATIENT NAME:  Spencer Johnson, Spencer Johnson MR#:  [id number] DATE OF BIRTH:  [birth year]  DATE OF ADMISSION:  01/22/2013  REFERRING PHYSICIAN:  Dr. Lucrezia Europe.   PRIMARY CARE PHYSICIAN:  Dr. Toy Cookey  PRIMARY ONCOLOGY PHYSICIAN:  Dr. Orlie Dakin.   CHIEF COMPLAINT: Feeling weak, not feeling well, neck pain, losing of voice.  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 Orlie Dakin. The patient is known to have stage IV left tonsil/throat cancer. He is DO NOT RESUSCITATE, currently resides at the nursing home. The patient was brought by EMS due to multiple complaints, mainly worsening pain in the neck, as well as losing his voice over the last few days, as well as productive secretions from his mouth, as well as feeling generally weak.  The patient did not have any fever, as well no leukocytosis and for his neck pain, the patient had CT neck and chest with contrast, which did show multiple findings, which was mainly significant for small amount of debris in the trachea, left mainstem bronchus and multiple segmental left lower lobe bronchi with possible obstructive atelectasis or pneumonia, as well as right middle lobe opacification and right lower lobe and left lingula pneumonia, with reticulonodular interstitial thickening in the left lower lobe, to a lesser extent in the right lower lobe which can represent atypical infectious process as well. The patient was started on vancomycin and Zosyn  for postobstructive pneumonia, as well as the patient complains of significant pain in the neck, which did not resolved with oral morphine or morphine via PEG tube in the nursing home. The patient is minimally verbal with significant hoarsening of his voice. Hospitalist service was requested to admit the patient for further management and workup for his symptoms. The patient complaining  of significant neck pain, even though his 20 mg of liquid Dilaudid at the nursing home every 4 hours, as well on Duragesic patch, still complains of neck pain.   PAST MEDICAL HISTORY: 1.  Stage IV tonsillar cancer left side, currently receiving chemotherapy with Dr. Orlie Dakin, last one on February 7, as well, he underwent radiation therapy. The patient is status post PEG, since he is unable to eat and swallow.  2.  History of subdural hematoma status post burr hole.  3.  History of seizures.  4.  Bipolar disorder.  5.  History of polysubstance abuse, including tobacco and alcohol.  6.  History of chronic hyponatremia.  7.  Osteoarthritis.  8.  Peptic ulcer disease.  9.  Gastroesophageal reflux disease.  10.  Anemia of chronic disease.   PAST SURGICAL HISTORY: 1.  PEG tube insertion.  2.  History of burr hole formation for treatment of subdural hematoma.   SOCIAL HISTORY:  Ex-chronic smoker and ex-alcoholic, currently residing at nursing home. HE IS DO NOT RESUSCITATE.   FAMILY HISTORY:  Mother died from cancer, unclear which type. Father died from CVA.   ALLERGIES:  NONSTEROIDAL, ANTI-INFLAMMATORY MEDICATION causing  PEPTIC ULCER DISEASE.   HOME MEDICATIONS: 1.  Morphine 20 mg per mL oral 1 mL every 4 hours as needed via PEG for pain.  2.  Guiatussin 200 mg every 8 hours as needed.  3.  Duragesic patch 100 mcg per hour 2 patches every 72 hours.  4.  Senna 2 tablets daily.  5.  Zofran 8 mg 3 times a day as needed.  6.  Famotidine  20 mg oral daily.  7.  Magnesium oxide 15 mL via tube daily.  8.  Simethicone 80 mg via tube every 6 hours as needed.  9.  Tylenol 650 via tube every 4 hours as needed.  10.  Keppra 1000 mg tablet 2 times a day.  11.  Dulcolax laxative suppository as needed.  12.  Ferrous sulfate 7.5 mL daily.  13.  Lactulose 30 mL 2 times a day.  14.  Colace 50 mg via tube 2 times a day.  15.  Metoclopramide 1 tablet 4 times a day as needed.  REVIEW OF  SYSTEMS: CONSTITUTIONAL:  The patient is a very poor historian, as well as cannot verbalize appropriately, so most of the answers were obtained from nodding his head. He denies any fever or chills, complains of fatigue and weakness.  EYES:  Denies blurry vision, double vision, inflammation.  ENT:  Denies tinnitus, ear pain, hearing loss, epistaxis. Has nasal discharge.  RESPIRATORY: Complains of cough. Denies any dyspnea, painful respiratory or wheezing.  CARDIOVASCULAR:  Denies chest pain, edema, arrhythmia, palpitations, syncope.  GASTROINTESTINAL:  Denies nausea, vomiting, diarrhea, abdominal pain, rectal bleed, coffee-ground emesis, hemorrhoids.  GENITOURINARY: Denies dysuria, hematuria, renal colic. ENDOCRINE:  Denies polyuria, polydipsia, heat or cold intolerance. HEMATOLOGIC:  Denies anemia, easy bruising, bleeding, diathesis.  INTEGUMENTARY:  Denies acne, rash or skin lesions.  MUSCULOSKELETAL: Complains of generalized body ache, as well as limited activity. Denies any cramps.  NEUROLOGICAL: Complains of generalized weakness and known history of seizures, complained of lightheadedness.  PSYCHIATRIC: Denies substance or alcohol abuse, insomnia, bipolar disorder. Had some anxiety.   PHYSICAL EXAMINATION: VITAL SIGNS: Temperature 98.9, pulse 84, respiratory rate 20, blood pressure 120/84, saturating 90% on room air.  GENERAL:  Thin, chronically ill-appearing male lying on bed in no apparent distress, emaciated. HEENT: Head atraumatic, normocephalic. Oral:  Very extremely poor dentition, unable to open mouth secondary to pain.  NECK:  Has left side firm mass and some radiation changes and sclerosis of the skin in that area, had a small area of ulceration with erythema covered by bandage.  RESPIRATORY:  The patient has good air entry bilaterally with scattered rhonchi.  CARDIOVASCULAR:  S1, S2 heard. No rubs, murmur or gallops.  ABDOMEN: Scaphoid, soft, nontender, nondistended. Bowel sounds  presents with PEG tube with no drainage or bleeding from site.  MUSCULOSKELETAL:  No edema. No clubbing. No cyanosis. Has significant muscle wasting.  SKIN:  No rash, petechiae. Has skin changes in the left neck area due to radiation.  NEUROLOGICAL:  The patient is awake, alert, answering questions appropriately. Cranial nerves grossly intact.  Appears to be moving all extremities without significant deficits.  PERTINENT LABORATORIES:  Glucose 86, BUN 11, creatinine 0.47, sodium 130, potassium 4.1, chloride 93, CO2 32, alkaline phosphatase 122, AST 40, ALT 43. White blood cells 7.6, hemoglobin 11.9, hematocrit 35.9, platelets 294.  EKG showing normal sinus rhythm with no significant ST or T wave changes.   ASSESSMENT AND PLAN: 1.  Stage IV throat/tonsillar cancer:  The patient is currently being followed and seen by oncologist service Dr. Orlie Dakin.  He is currently on chemotherapy; last dose received on February 7, will consult Dr. Orlie Dakin to further evaluate his throat cancer. As well, the patient seems to be having his pain uncontrolled despite being on large dose on Duragesic and oral morphine, will consult palliative care service for further evaluation for his pain management. 2.  Postobstructive pneumonia, as evident on patient's CT chest with IV contrast.  The patient will be started on vancomycin and Zosyn. Will follow on the blood cultures, as well as we will consult pulmonary service to evaluate the possible need for bronchoscopy to clear the debridement, will continue patient on albuterol treatment and oxygen as needed meanwhile.  3.  History of seizures:  Will continue with Keppra.  4.  History of gastroesophageal reflux disease:  Will continue with famotidine.  5. Dysphagia:  Will continue with feeding via PEG, will keep patient n.p.o. 6.  Deep vein thrombosis prophylaxis:  Subcutaneous heparin. 7.  Gastrointestinal prophylaxis:  We will have patient continue on famotidine.  CODE  STATUS:  The patient has yellow form states he is DO NOT RESUSCITATE, discussed with the patient and he confirmed his code status of DNR/DNI.  TOTAL TIME SPENT ON ADMISSION AND PATIENT CARE:  60 minutes    ____________________________ Starleen Armsawood S. Aking Klabunde, MD dse:ce D: 01/22/2013 08:32:19 ET T: 01/22/2013 14:45:52 ET JOB#: [id number]  cc: Starleen Armsawood S. Devanshi Califf, MD, <Dictator> Melquiades Kovar Teena IraniS Kohler Pellerito MD ELECTRONICALLY SIGNED 01/24/2013 3:52

## 2015-03-30 NOTE — Discharge Summary (Signed)
PATIENT NAME:  Spencer Johnson, Spencer Johnson MR#:  [id number] DATE OF BIRTH:  10-10-1951  DATE OF ADMISSION:  01/22/2013 DATE OF DISCHARGE:  01/28/2013.   ADMITTING DIAGNOSIS: Feeling weak, having neck pain, difficulty with speech.   DISCHARGE DIAGNOSES:  1. Neck pain and difficulty with speech, et. Ronney Asters, related to his stage IV throat/tonsillar cancer, status post evaluation by Oncology. They state that his PET scan neck did look recently better, continue chemotherapy as per them.  2. Possible postobstructiving pneumonia evident on CT scan; however, the patient had no lesions to cause obstruction so it is likely a pneumonia. He has been afebrile and WBC count has been normal. He is currently on oral antibiotics.  3. Severe pain due to his ongoing cancer, been seen by Palliative Care. The patient is DO NOT RESUSCITATE. He is started on a PCA, prescription to the facility have been sent.  4. A history of seizures. Keppra.  5. Dysphagia as a result of his ongoing throat/tonsillar cancer. The patient has PEG tube with feedings. He has been tolerating those well.  6. A history of subdural hematoma status post bur hole in the past.  7. Bipolar disorder.  8. A history of polysubstance abuse in the past including tobacco and alcohol.  9. A history of chronic hyponatremia.  10. Osteoarthritis.  11. Anemia of chronic disease.  12. A history of PEG tube insertion.  13. A history of burr hole for subdural hematoma.   CONSULTANTS DURING HOSPITALIZATION: Dr. Grayland Ormond, Dr. Neoma Laming of Pulmonary as well as Dr. Izora Gala Phifer of Palliative Care.  PERTINENT LABS AND EVALUATIONS: BMP: Glucose was 86, BUN was 11, creatinine 0.47, sodium was 130, potassium 4.1, chloride 93, CO2 was 32. His LFTs showed a total protein of 8.3, albumin of 3.0, alk phos was 122, AST was 40. WBC count 7.6, hemoglobin 11.9, platelet count 294.   URINALYSIS: Nitrites negative, leukocytes negative.   EKG showed sinus rhythm with occasional  PVCs. The patient's CT scan of the chest showed a large mass in the soft tissue of the neck on the left with the invasion of multiple structures. There is questionable invasion of the left neuroforamina within the cervical spine. There are areas of severe narrowing versus possible invasion occlusion of the left internal jugular vein, left CCA, ICA and ECA are patent, extensive opacification of the left mastoid air cells, postsurgical changes within the left. Also the CT of the chest showed a small amount of debris within the trachea, multiple areas of patchy opacification within the lungs, reticulonodular interstitial findings within the lungs parenchyma with a left lower lobe to a lesser extent to the right.   HOSPITAL COURSE: The patient is a 64 year old white male with stage IV left tonsillar throat cancer who presented to the ED with worsening neck pain, difficulty with speech. He was seen in the ED, had a CT scan of the neck, which showed the above findings. The patient has had problems with dysphagia and he has had no significant pain at the facility but was getting oral treatment and Duragesic patch. However, his pain was not well controlled. Due to his symptoms he was admitted. There was a question whether he had an obstructive pneumonia. However, he was afebrile. His WBC count was normal. The patient was treated with IV antibiotics, and subsequently switched to antibiotics via his PEG tube. The patient has not had any other complaints besides significant pain. He was seen in consultation by Palliative Care team, who placed him on  Dilaudid PCA which he will be able to continue at the facility. He is a DO NOT RESUSCITATE. He was seen by Dr. Grayland Ormond who reports that his recent PET scan showed improvement. At this time he is doing much better and his pain is under control. His prognosis seems to be very poor.   DISCHARGE MEDICATIONS:  1. Simethicone 80 mg 1 tablet by G-tube every 6 hours as needed for  gas.  2. Tylenol 650 mg every 4 hours p.r.n. for pain or fever.  3. Keppra 100, 1 tab down G-tube b.i.d.  4. Dulcolax suppository once a day as needed for constipation. 5. Zofran 8 mg 1 tab 3 times per day as needed for nausea.  6. Senna 2 tabs once a day for constipation. 7. Lactulose 30 mL b.i.d.  8. Reglan 5, 1 tab 4 times daily via G-tube. 9. Iron sulfate via G-tube 7.5 mL daily.  10. Colace 10 mL via G-tube b.i.d.  11. Magnesium hydroxide 30 mL once a day at bedtime.  12. Dilaudid 0.2 mg, lockout interval 15 minutes, 4-hour limit of 7.2 mg. Prescription has been sent by Palliative Care. Follow that for his PCA. 13. Doxycycline 100 mg p.o. every 12 hours x5 days.  14. Augmentin 875 mg 1 tab p.o. b.i.d. x 5 days.   DIET: N.p.o. He will get tube feeds via G-tube,  ACTIVITY: As tolerated.  FOLLOW-UP: Dr. Grayland Ormond in 1 to 2 weeks.   TIME SPENT: 45 minutes.    ____________________________ Lafonda Mosses Posey Pronto, MD shp:jm D: 01/28/2013 11:53:47 ET T: 01/28/2013 12:57:07 ET JOB#: [id number]  cc: Elmyra Banwart H. Posey Pronto, MD, <Dictator> Alric Seton MD ELECTRONICALLY SIGNED 01/29/2013 12:16

## 2015-03-30 NOTE — Consult Note (Signed)
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   HPI   Patient last evaluated in clinic on January 14, 2013 when he received chemotherapy with cisplatin and cetuximab.  Patient presented to the emergency room with increasing weakness and fatigue, increasing pain, and worsening hoarseness of voice.  Currently, his symptoms have improved although he still not back to his baseline.  His pain is better controlled. He continues to have difficulty swallowing and requires a PEG tube for nutrition. He has no neurologic complaints.  He denies any recent fevers.  He does not complain of chest pain, cough, or shortness of breath.  He has no nausea, vomiting, constipation, or diarrhea. Patient offers no further specific complaints.  PFSH:  Additional Past Medical and Surgical History Past medical history: Bipolar disorder, subdural hematoma requiring burr hole.  Past surgical history: "Stomach surgery".  Family history: Negative and noncontributory.  Social history: Long history of tobacco use. Denies alcohol.  Recently parolled in June of 2013.   Review of Systems:  Performance Status (ECOG) 2   Review of Systems   As per HPI. Otherwise, 10 point system review was negative.  NURSING NOTES: **Vital Signs.:   16-Feb-14 09:50   Vital Signs Type: Routine   Temperature Temperature (F): 98.2   Celsius: 36.7   Pulse Pulse: 65   Respirations Respirations: 12   Systolic BP Systolic BP: 99   Diastolic BP (mmHg) Diastolic BP (mmHg): 67   Mean BP: 77   Pulse Ox % Pulse Ox %: 96   Pulse Ox Activity Level: At rest   Oxygen Delivery: Room Air/ 21 %   Physical Exam:  Physical Exam General: ill-appearing, cachectic, no acute distress. Eyes: Pink conjunctiva, anicteric sclera. HEENT: ulcerations noted on left neck, unchanged. Lungs: scattered wheezing and rhonchi. Heart:  Regular rate and rhythm. No rubs, murmurs, or gallops. Abdomen: Soft, normoactive bowel sounds. PEG tube noted. Musculoskeletal: No edema, cyanosis, or clubbing. Neuro: Alert, answering all questions appropriately. Cranial nerves grossly intact. Skin: No rashes or petechiae noted. Psych: Normal affect.    Sulfa drugs: Blisters  NSAIDS: Unknown    morphine 20 mg/mL oral concentrate: 1 mL orally every 4 hours, Status: Active, Quantity: 180, Refills: None   Duragesic-100 100 mcg/hr transdermal film, extended release: 2 PATCH transdermal every 72 hours, Status: Active, Quantity: 20, Refills: None   Senna S 50 mg-8.6 mg oral tablet: 2 tab(s) orally once a day (at bedtime), Status: Active, Quantity: 20, Refills: 4   ondansetron 8 mg tablet: 1 tab(s) orally 3 times a day- as needed , Status: Active, Quantity: 20, Refills: None   twocal hn liquid: 237 milliliter(s) via enteral tube 4 times a day for nutrition., Status: Active, Quantity: 0, Refills: None   magnesium hydroxide: 15 milliliter(s) via g tube once a day as needed for constipation., Status: Active, Quantity: 0, Refills: None   simethicone 80 mg oral tablet, chewable: 1 tab(s) via g tube every 6 hours as needed for gas., Status: Active, Quantity: 0, Refills: None   Tylenol 325 mg oral tablet: 2 tab(s) via g tube every 4 hours as needed for pain or fever., Status: Active, Quantity: 0, Refills: None   Keppra 1000 mg oral tablet: 1 tab(s) orally 2 times a day, Status: Active, Quantity: 0, Refills: None   Dulcolax Laxative 10 mg rectal suppository: 1 suppository(ies) rectal once a day, As Needed-for Constipation , Status: Active, Quantity:  0, Refills: None   lactulose 10 g/15 mL oral syrup: 30 milliliter(s) orally 2 times a day, Status: Active, Quantity: 0, Refills: None   Colace 50 mg/5 mL oral syrup: 10 milliliter(s) orally by g tube 2 times a day, Status: Active, Quantity: 0, Refills: None   metoclopramide 5 mg oral tablet: 1  tab(s) orally 4 times a day (before meals and at bedtime) crush and give via G tube, Status: Active, Quantity: 0, Refills: None   ferrous sulfate 220 mg/5 mL (44 mg elemental iron) oral elixir: 7.5 milliliter(s) orally once a day, Status: Active, Quantity: 0, Refills: None   Roxanol $Remove'20mg'SLiGEFU$ /ml: 1 milliliter(s)  every 2 to 3 hours, As Needed- for Pain , Status: Active, Quantity: 0, Refills: None  Laboratory Results: Routine Chem:  16-Feb-14 02:58   Result Comment LABS - This specimen was collected through an   - indwelling catheter or arterial line.  - A minimum of 24mls of blood was wasted prior    - to collecting the sample.  Interpret  - results with caution.  Result(s) reported on 23 Jan 2013 at 03:15AM.  Glucose, Serum  120  BUN 15  Creatinine (comp) 0.60  Sodium, Serum  133  Potassium, Serum 3.9  Chloride, Serum  95  CO2, Serum  33  Calcium (Total), Serum 9.3  Anion Gap  5  Osmolality (calc) 268  eGFR (African American) >60  eGFR (Non-African American) >60 (eGFR values <38mL/min/1.73 m2 may be an indication of chronic kidney disease (CKD). Calculated eGFR is useful in patients with stable renal function. The eGFR calculation will not be reliable in acutely ill patients when serum creatinine is changing rapidly. It is not useful in  patients on dialysis. The eGFR calculation may not be applicable to patients at the low and high extremes of body sizes, pregnant women, and vegetarians.)  Routine Hem:  16-Feb-14 02:58   WBC (CBC) 8.7  RBC (CBC)  4.14  Hemoglobin (CBC)  10.5  Hematocrit (CBC)  33.9  Platelet Count (CBC) 353  MCV 82  MCH  25.5  MCHC  31.1  RDW 14.2  Neutrophil % 93.4  Lymphocyte % 2.9  Monocyte % 3.6  Eosinophil % 0.0  Basophil % 0.1  Neutrophil #  8.2  Lymphocyte #  0.3  Monocyte # 0.3  Eosinophil # 0.0  Basophil # 0.0   Radiology Results: CT:    15-Feb-14 05:02, CT Neck and Chest with Contrast  CT Neck and Chest with Contrast   REASON FOR  EXAM:    h/o throat cancer with left neck pain radiating to   back eval thrombus  COMMENTS:       PROCEDURE: CT  - CT NECK AND CHEST W  - Jan 22 2013  5:02AM     RESULT:     Technique: Helical 3 mm sections were obtained of the neck statuspost   intravenous administration of 100 mL of Isovue-370. Sagittal and coronal   reformatted images were submitted as well as 3-D volume rendered images.    Findings: The nasopharynx demonstrates left-sided septal deviation.   Evaluation of the oropharynx demonstrates a large soft tissue mass in the   soft tissues of the left side of the neck extending from the skin surface     to the parapharyngeal space and also extending back to the posterior   cervical triangle. The lesion measures 10.4 x 8.8 x 4.8 cm.    There is invasion of multiple soft tissues including the left  parotid and   submandibular glands, the left palatine tonsil, and multiple muscles of   the left side of the neck. There is also questionable extension into the   left neuralforamina and multiple cervical vertebrae. The mass causes   rightward deviation of the hypopharyngeal airway. The airway is patent.    The left CCA, ICA and ECA are narrowed by the mass but patent. There is   severe narrowing of the left internal jugular vein. A long segment of the   left IJ is not visualized, which may represent occlusion. There are   dilated tortuous vessels in the region of the left parotid gland which   are likely compensatory. There is no evidence of loculated fluid   collections to suggest abscess. The epiglottis is unremarkable. The right     parotid gland and right submandibular glands are unremarkable. No focal   abnormality is identified within the thyroid. There is no gross   adenopathy. There is extensive opacification of the left mastoid air   cells. The paranasal sinuses and right mastoid air cells are   unremarkable. Limited evaluation of the skull base is unremarkable. A    right-sided dialysis catheter is identified within the internal jugular   vein. Postsurgical changes are appreciated within the soft tissues of the   neck on the left.    CONCLUSION:    1. Large mass in the soft tissues of the neck on the left as described   above with invasion of multiple structures. There is questionable   invasionof the left neural foramina within the cervical spine. There are   areas of severe narrowing versus possible invasion and occlusion of the     left internal jugular vein. The left CCA, ICA, ECA are narrow but patent.  2. Extensive opacification in the left mastoid air cells.  3. Postsurgical changes within the left.  4. The remaining findings as described above.    Chest: Helical 3 mm sections were obtained from the thoracic inlet   through the lung bases. Sagittal and coronal reformatted images were   obtained. 3-D volumetric images were also submitted.    Findings: Debris layering is appreciated within the upper thoracic   trachea possibly representing bronchial secretions. There is also debris   in the left mainstem bronchus extending to multiple left lower lobe   bronchi. There is airspace disease in the left lower lobe possibly   representing postobstructive atelectasis or pneumonia. There are multiple   areas of patchy opacification in the right middle lobe, right lower lobe     andlingula. These findings may also represent multifocal pneumonitis.   There is reticulonodular interstitial thickening in the left lower lobe   and to a lesser extent the right lower lobe. Different considerations are   atypical infectious process including mycobacteria, mycoplasma or fungal   organisms. Alternatively, this may represent reticulonodular tumor   invasion. Clinical correlation recommended. There is no evidence of   pneumothorax or effusion. The mediastinum and hilar regions and   structures are unremarkable.    Vascular calcification is identified  within the aorta. There is no   evidence of aortic dissection or aneurysm. The right subclavian vein and   superior vena cava are patent. The pulmonary vessels are patent. The   aorta and great vessels of the chest are patent. Specifically, the   visualized portion of the left subclavian artery is patent. The left   subclavian vein could not be evaluated secondary to lack of  contrast.     This was a right-sided injection. A dialysis catheter is appreciated on   the right.    IMPRESSION:      1. Small amount of debris within the trachea as described above.  2. Multiple areas of patchy opacification within the lungs. Differential   considerations as described above.   3. Reticulonodular interstitial findings within the lung parenchyma   within the left lower lobe and to a lesser extent on the right. Different   considerations as described above  4. The vasculature demonstrates patency of the right subclavian and right   superior vena cava. There is no CT evidence of pulmonary arterial embolic   disease, aortic aneurysm nor dissection. The left subclavian artery is   patent. The left subclavian vein could not be evaluated as described     above.  5. Incidental findings as described above.  6. Charlesetta Ivory of the Emergency Department was informed of these   findings via a preliminary faxed report.    Thank you for the opportunity to contribute to the care of your patient.         Verified By: Mikki Santee, M.D., MD   Assessment and Plan: Impression:   Recurrent stage IVa left tonsil cancer, pneumonia, pain. Plan:   1.  Tonsillar cancer: PET scan results noted and improved.  Patient received cisplatin and cetuximab on January 14, 2013.  He has been instructed to keep his previously scheduled followup appointment next week.  Will place a palliative care consult for symptom management.  Pain: Continue current narcotic regimen.  Palliative care as above. Pneumonia: Agree with  current antibiotics. Difficulty swallowing: A swallow study has been completed. Continue nutrition via PEG tube. Anemia: Secondary to chemotherapy, monitor. consult, will follow.   Electronic Signatures: Delight Hoh (MD)  (Signed [phone] 13:12)  Authored: HISTORY OF PRESENT ILLNESS, PFSH, ROS, NURSING NOTES, PE, ALLERGIES, HOME MEDICATIONS, LABS, OTHER RESULTS, ASSESSMENT AND PLAN   Last Updated: 16-Feb-14 13:12 by Delight Hoh (MD)
